# Patient Record
Sex: Female | Born: 1970 | Race: Black or African American | Hispanic: No | Smoking: Never smoker
Health system: Southern US, Community
[De-identification: ages and names within clinical notes are randomized; demographics above are authoritative.]

## PROBLEM LIST (undated history)

## (undated) DIAGNOSIS — L309 Dermatitis, unspecified: Secondary | ICD-10-CM

## (undated) DIAGNOSIS — IMO0002 Reserved for concepts with insufficient information to code with codable children: Secondary | ICD-10-CM

## (undated) DIAGNOSIS — I10 Essential (primary) hypertension: Secondary | ICD-10-CM

## (undated) HISTORY — PX: LAPAROSCOPY: SHX197

## (undated) HISTORY — DX: Dermatitis, unspecified: L30.9

## (undated) HISTORY — DX: Reserved for concepts with insufficient information to code with codable children: IMO0002

## (undated) HISTORY — DX: Essential (primary) hypertension: I10

---

## 1997-11-29 ENCOUNTER — Inpatient Hospital Stay (HOSPITAL_COMMUNITY): Admission: AD | Admit: 1997-11-29 | Discharge: 1997-12-01 | Payer: Self-pay | Admitting: Obstetrics & Gynecology

## 1997-12-28 ENCOUNTER — Encounter: Admission: RE | Admit: 1997-12-28 | Discharge: 1998-03-28 | Payer: Self-pay | Admitting: Obstetrics and Gynecology

## 1998-03-30 ENCOUNTER — Encounter (HOSPITAL_COMMUNITY): Admission: RE | Admit: 1998-03-30 | Discharge: 1998-06-28 | Payer: Self-pay | Admitting: Obstetrics and Gynecology

## 1998-10-07 ENCOUNTER — Other Ambulatory Visit: Admission: RE | Admit: 1998-10-07 | Discharge: 1998-10-07 | Payer: Self-pay | Admitting: Obstetrics and Gynecology

## 1998-10-22 DIAGNOSIS — R87619 Unspecified abnormal cytological findings in specimens from cervix uteri: Secondary | ICD-10-CM

## 1998-10-22 DIAGNOSIS — IMO0002 Reserved for concepts with insufficient information to code with codable children: Secondary | ICD-10-CM

## 1998-10-22 HISTORY — DX: Reserved for concepts with insufficient information to code with codable children: IMO0002

## 1998-10-22 HISTORY — PX: CERVICAL BIOPSY  W/ LOOP ELECTRODE EXCISION: SUR135

## 1998-10-22 HISTORY — DX: Unspecified abnormal cytological findings in specimens from cervix uteri: R87.619

## 1999-03-31 ENCOUNTER — Inpatient Hospital Stay (HOSPITAL_COMMUNITY): Admission: AD | Admit: 1999-03-31 | Discharge: 1999-04-02 | Payer: Self-pay | Admitting: Obstetrics and Gynecology

## 1999-07-06 ENCOUNTER — Other Ambulatory Visit: Admission: RE | Admit: 1999-07-06 | Discharge: 1999-07-06 | Payer: Self-pay | Admitting: Obstetrics and Gynecology

## 1999-07-06 ENCOUNTER — Encounter (INDEPENDENT_AMBULATORY_CARE_PROVIDER_SITE_OTHER): Payer: Self-pay

## 1999-10-02 ENCOUNTER — Encounter (INDEPENDENT_AMBULATORY_CARE_PROVIDER_SITE_OTHER): Payer: Self-pay | Admitting: Specialist

## 1999-10-02 ENCOUNTER — Ambulatory Visit (HOSPITAL_COMMUNITY): Admission: RE | Admit: 1999-10-02 | Discharge: 1999-10-02 | Payer: Self-pay | Admitting: Obstetrics and Gynecology

## 2000-03-03 ENCOUNTER — Encounter: Admission: RE | Admit: 2000-03-03 | Discharge: 2000-03-03 | Payer: Self-pay | Admitting: General Surgery

## 2000-10-22 HISTORY — PX: BREAST BIOPSY: SHX20

## 2001-02-28 ENCOUNTER — Encounter (HOSPITAL_BASED_OUTPATIENT_CLINIC_OR_DEPARTMENT_OTHER): Payer: Self-pay | Admitting: General Surgery

## 2001-02-28 ENCOUNTER — Other Ambulatory Visit: Admission: RE | Admit: 2001-02-28 | Discharge: 2001-02-28 | Payer: Self-pay | Admitting: General Surgery

## 2001-02-28 ENCOUNTER — Encounter: Admission: RE | Admit: 2001-02-28 | Discharge: 2001-02-28 | Payer: Self-pay | Admitting: General Surgery

## 2001-03-03 ENCOUNTER — Encounter (HOSPITAL_BASED_OUTPATIENT_CLINIC_OR_DEPARTMENT_OTHER): Payer: Self-pay | Admitting: General Surgery

## 2001-04-07 ENCOUNTER — Other Ambulatory Visit: Admission: RE | Admit: 2001-04-07 | Discharge: 2001-04-07 | Payer: Self-pay | Admitting: Obstetrics and Gynecology

## 2001-07-15 ENCOUNTER — Other Ambulatory Visit: Admission: RE | Admit: 2001-07-15 | Discharge: 2001-07-15 | Payer: Self-pay | Admitting: Obstetrics and Gynecology

## 2002-01-01 ENCOUNTER — Other Ambulatory Visit: Admission: RE | Admit: 2002-01-01 | Discharge: 2002-01-01 | Payer: Self-pay | Admitting: Obstetrics and Gynecology

## 2002-09-01 ENCOUNTER — Other Ambulatory Visit: Admission: RE | Admit: 2002-09-01 | Discharge: 2002-09-01 | Payer: Self-pay | Admitting: Obstetrics and Gynecology

## 2003-03-02 ENCOUNTER — Other Ambulatory Visit: Admission: RE | Admit: 2003-03-02 | Discharge: 2003-03-02 | Payer: Self-pay | Admitting: Obstetrics and Gynecology

## 2003-08-24 ENCOUNTER — Other Ambulatory Visit: Admission: RE | Admit: 2003-08-24 | Discharge: 2003-08-24 | Payer: Self-pay | Admitting: Obstetrics and Gynecology

## 2004-03-08 ENCOUNTER — Other Ambulatory Visit: Admission: RE | Admit: 2004-03-08 | Discharge: 2004-03-08 | Payer: Self-pay | Admitting: Obstetrics and Gynecology

## 2004-08-22 ENCOUNTER — Other Ambulatory Visit: Admission: RE | Admit: 2004-08-22 | Discharge: 2004-08-22 | Payer: Self-pay | Admitting: Obstetrics and Gynecology

## 2005-03-01 ENCOUNTER — Other Ambulatory Visit: Admission: RE | Admit: 2005-03-01 | Discharge: 2005-03-01 | Payer: Self-pay | Admitting: Obstetrics and Gynecology

## 2005-03-27 ENCOUNTER — Ambulatory Visit: Payer: Self-pay | Admitting: Family Medicine

## 2005-08-06 ENCOUNTER — Ambulatory Visit: Payer: Self-pay | Admitting: Family Medicine

## 2005-08-21 ENCOUNTER — Ambulatory Visit: Payer: Self-pay | Admitting: Family Medicine

## 2005-09-19 ENCOUNTER — Other Ambulatory Visit: Admission: RE | Admit: 2005-09-19 | Discharge: 2005-09-19 | Payer: Self-pay | Admitting: Obstetrics and Gynecology

## 2006-02-21 ENCOUNTER — Other Ambulatory Visit: Admission: RE | Admit: 2006-02-21 | Discharge: 2006-02-21 | Payer: Self-pay | Admitting: Obstetrics and Gynecology

## 2006-03-29 ENCOUNTER — Ambulatory Visit: Payer: Self-pay | Admitting: Family Medicine

## 2006-06-28 ENCOUNTER — Ambulatory Visit: Payer: Self-pay | Admitting: Family Medicine

## 2006-08-27 ENCOUNTER — Other Ambulatory Visit: Admission: RE | Admit: 2006-08-27 | Discharge: 2006-08-27 | Payer: Self-pay | Admitting: Obstetrics and Gynecology

## 2006-10-04 ENCOUNTER — Ambulatory Visit: Payer: Self-pay | Admitting: Family Medicine

## 2007-05-09 ENCOUNTER — Ambulatory Visit: Payer: Self-pay | Admitting: Internal Medicine

## 2007-05-15 ENCOUNTER — Telehealth (INDEPENDENT_AMBULATORY_CARE_PROVIDER_SITE_OTHER): Payer: Self-pay | Admitting: *Deleted

## 2007-06-26 DIAGNOSIS — J309 Allergic rhinitis, unspecified: Secondary | ICD-10-CM | POA: Insufficient documentation

## 2007-06-26 DIAGNOSIS — G43909 Migraine, unspecified, not intractable, without status migrainosus: Secondary | ICD-10-CM | POA: Insufficient documentation

## 2007-10-08 ENCOUNTER — Telehealth: Payer: Self-pay | Admitting: Family Medicine

## 2007-10-23 HISTORY — PX: TUBAL LIGATION: SHX77

## 2008-02-11 ENCOUNTER — Ambulatory Visit: Payer: Self-pay | Admitting: Family Medicine

## 2008-02-11 DIAGNOSIS — M542 Cervicalgia: Secondary | ICD-10-CM

## 2008-06-03 ENCOUNTER — Ambulatory Visit (HOSPITAL_COMMUNITY): Admission: RE | Admit: 2008-06-03 | Discharge: 2008-06-03 | Payer: Self-pay | Admitting: Obstetrics and Gynecology

## 2008-10-13 ENCOUNTER — Inpatient Hospital Stay (HOSPITAL_COMMUNITY): Admission: AD | Admit: 2008-10-13 | Discharge: 2008-10-15 | Payer: Self-pay | Admitting: Obstetrics and Gynecology

## 2008-10-14 ENCOUNTER — Encounter (INDEPENDENT_AMBULATORY_CARE_PROVIDER_SITE_OTHER): Payer: Self-pay | Admitting: Obstetrics and Gynecology

## 2008-11-16 ENCOUNTER — Ambulatory Visit: Payer: Self-pay | Admitting: Family Medicine

## 2008-11-16 DIAGNOSIS — L259 Unspecified contact dermatitis, unspecified cause: Secondary | ICD-10-CM

## 2009-02-02 ENCOUNTER — Emergency Department (HOSPITAL_COMMUNITY): Admission: EM | Admit: 2009-02-02 | Discharge: 2009-02-02 | Payer: Self-pay | Admitting: Family Medicine

## 2009-02-02 ENCOUNTER — Telehealth: Payer: Self-pay | Admitting: Family Medicine

## 2009-05-05 ENCOUNTER — Emergency Department (HOSPITAL_COMMUNITY): Admission: EM | Admit: 2009-05-05 | Discharge: 2009-05-05 | Payer: Self-pay | Admitting: Family Medicine

## 2009-05-25 ENCOUNTER — Ambulatory Visit: Payer: Self-pay | Admitting: Family Medicine

## 2009-05-25 DIAGNOSIS — R3915 Urgency of urination: Secondary | ICD-10-CM

## 2009-05-25 LAB — CONVERTED CEMR LAB
Bilirubin Urine: NEGATIVE
Blood in Urine, dipstick: NEGATIVE
Glucose, Urine, Semiquant: NEGATIVE
Ketones, urine, test strip: NEGATIVE
Nitrite: NEGATIVE
Specific Gravity, Urine: 1.015
Urobilinogen, UA: 0.2
pH: 7

## 2009-09-27 ENCOUNTER — Ambulatory Visit: Payer: Self-pay | Admitting: Family Medicine

## 2009-09-27 LAB — CONVERTED CEMR LAB
ALT: 12 units/L (ref 0–35)
AST: 17 units/L (ref 0–37)
Albumin: 4.1 g/dL (ref 3.5–5.2)
Alkaline Phosphatase: 45 units/L (ref 39–117)
BUN: 11 mg/dL (ref 6–23)
Basophils Absolute: 0 10*3/uL (ref 0.0–0.1)
Basophils Relative: 0.5 % (ref 0.0–3.0)
Bilirubin Urine: NEGATIVE
Bilirubin, Direct: 0.1 mg/dL (ref 0.0–0.3)
CO2: 28 meq/L (ref 19–32)
Calcium: 9.2 mg/dL (ref 8.4–10.5)
Chloride: 108 meq/L (ref 96–112)
Cholesterol: 171 mg/dL (ref 0–200)
Creatinine, Ser: 0.6 mg/dL (ref 0.4–1.2)
Eosinophils Absolute: 0 10*3/uL (ref 0.0–0.7)
Eosinophils Relative: 0.8 % (ref 0.0–5.0)
GFR calc non Af Amer: 143.26 mL/min (ref >60)
Glucose, Bld: 85 mg/dL (ref 70–99)
HCT: 39.2 % (ref 36.0–46.0)
HDL: 71.5 mg/dL (ref >39.00)
Hemoglobin: 13.3 g/dL (ref 12.0–15.0)
Ketones, ur: NEGATIVE mg/dL
LDL Cholesterol: 92 mg/dL (ref 0–99)
Leukocytes, UA: NEGATIVE
Lymphocytes Relative: 32.1 % (ref 12.0–46.0)
Lymphs Abs: 1.3 10*3/uL (ref 0.7–4.0)
MCHC: 33.8 g/dL (ref 30.0–36.0)
MCV: 96.9 fL (ref 78.0–100.0)
Monocytes Absolute: 0.4 10*3/uL (ref 0.1–1.0)
Monocytes Relative: 9.9 % (ref 3.0–12.0)
Neutro Abs: 2.3 10*3/uL (ref 1.4–7.7)
Neutrophils Relative %: 56.7 % (ref 43.0–77.0)
Nitrite: NEGATIVE
Platelets: 163 10*3/uL (ref 150.0–400.0)
Potassium: 3.7 meq/L (ref 3.5–5.1)
RBC: 4.05 M/uL (ref 3.87–5.11)
RDW: 12.2 % (ref 11.5–14.6)
Sodium: 141 meq/L (ref 135–145)
Specific Gravity, Urine: >=1.03 (ref 1.000–1.030)
TSH: 0.97 microintl units/mL (ref 0.35–5.50)
Total Bilirubin: 0.7 mg/dL (ref 0.3–1.2)
Total CHOL/HDL Ratio: 2
Total Protein, Urine: NEGATIVE mg/dL
Total Protein: 7.1 g/dL (ref 6.0–8.3)
Triglycerides: 38 mg/dL (ref 0.0–149.0)
Urine Glucose: NEGATIVE mg/dL
Urobilinogen, UA: 0.2 (ref 0.0–1.0)
VLDL: 7.6 mg/dL (ref 0.0–40.0)
WBC: 4 10*3/uL — ABNORMAL LOW (ref 4.5–10.5)
pH: 5.5 (ref 5.0–8.0)

## 2009-10-05 ENCOUNTER — Ambulatory Visit: Payer: Self-pay | Admitting: Family Medicine

## 2009-10-05 ENCOUNTER — Encounter (INDEPENDENT_AMBULATORY_CARE_PROVIDER_SITE_OTHER): Payer: Self-pay | Admitting: *Deleted

## 2009-10-05 DIAGNOSIS — L0100 Impetigo, unspecified: Secondary | ICD-10-CM

## 2009-11-27 IMAGING — US US AMNIOCENTESIS
1 series · 3 of 3 positions shown · non-contrast
Comparison: none

OBSTETRICAL ULTRASOUND:
 This ultrasound exam was performed in the [HOSPITAL] Ultrasound Department.  The OB US report was generated in the AS system, and faxed to the ordering physician.  This report is also available in [REDACTED] PACS.

[Series 1: us amniocentesis · 0.28mm/px · 3 of 3 slices shown]
[im 1/3]
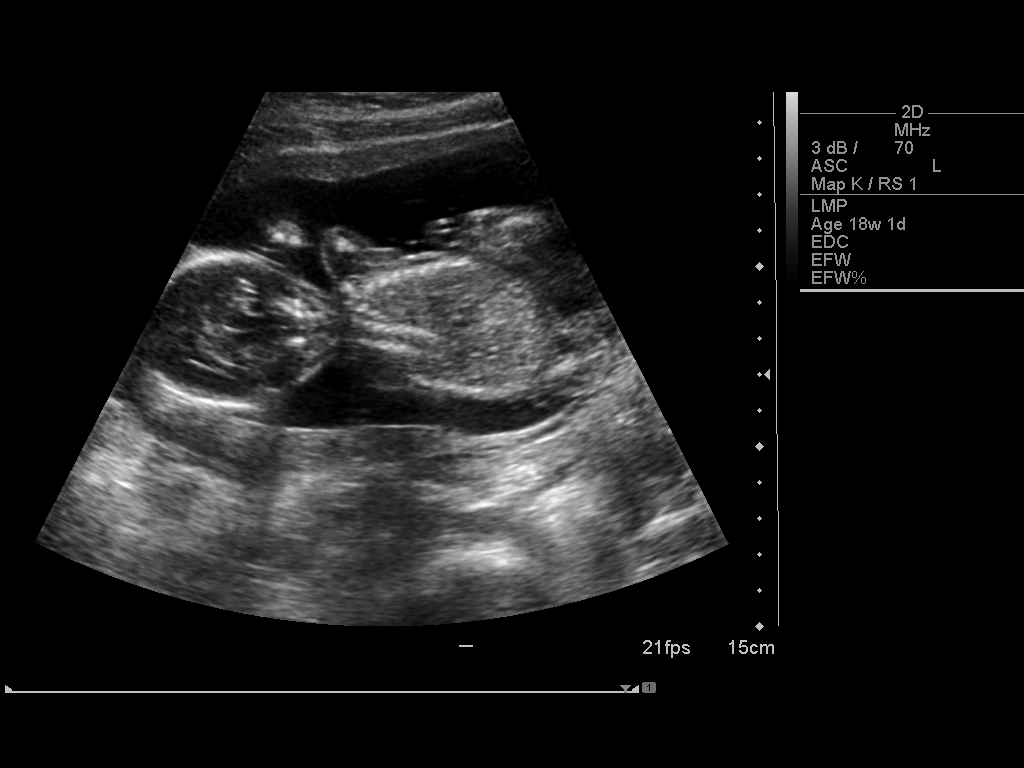
[im 2/3]
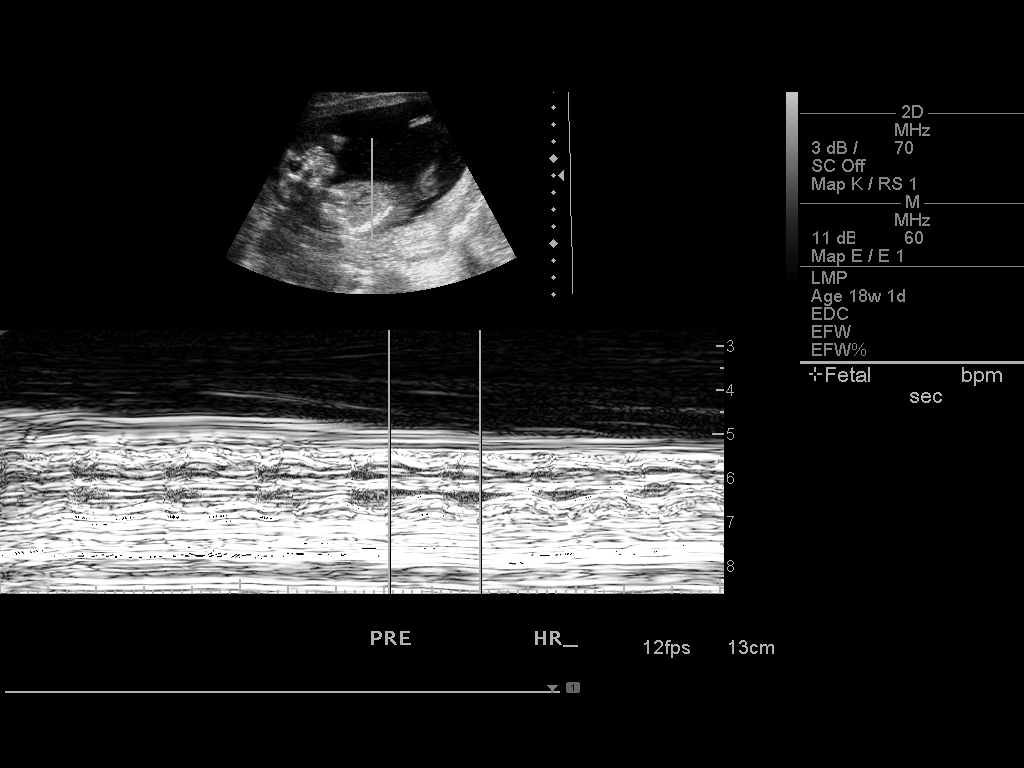
[im 3/3]
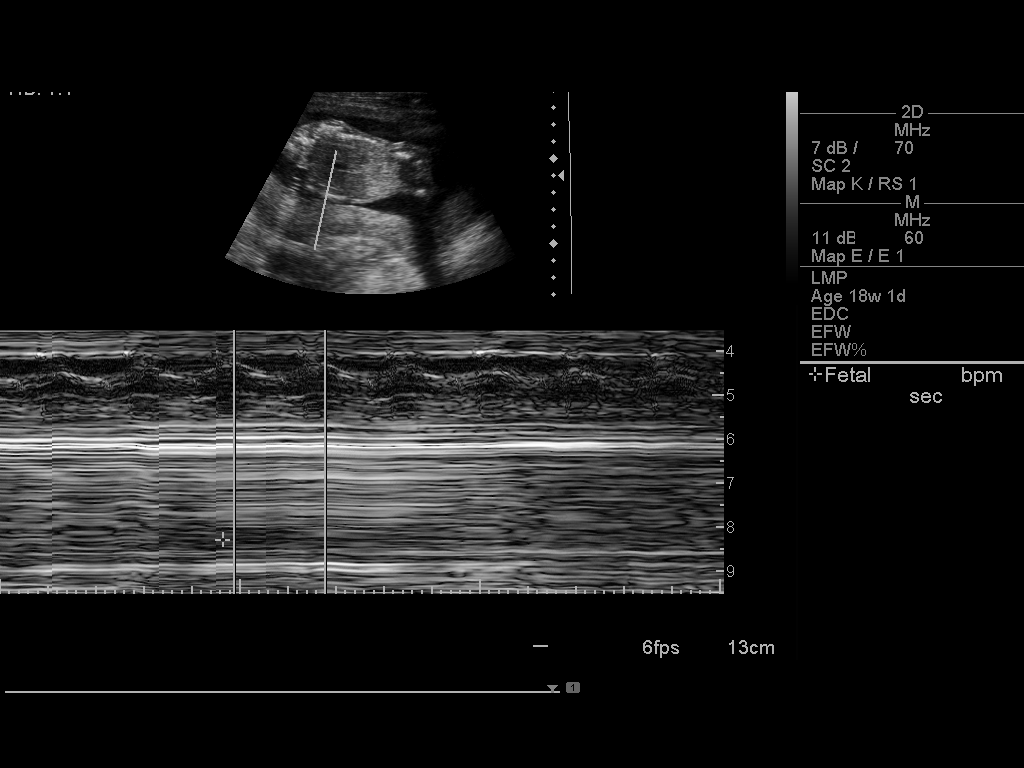

[3 of 3 positions shown; findings below may reference images not displayed]

IMPRESSION: See AS Obstetric US report.

## 2009-11-29 ENCOUNTER — Emergency Department (HOSPITAL_COMMUNITY): Admission: EM | Admit: 2009-11-29 | Discharge: 2009-11-29 | Payer: Self-pay | Admitting: Emergency Medicine

## 2009-12-02 ENCOUNTER — Ambulatory Visit: Payer: Self-pay | Admitting: Internal Medicine

## 2009-12-02 ENCOUNTER — Encounter: Payer: Self-pay | Admitting: Family Medicine

## 2010-03-13 ENCOUNTER — Ambulatory Visit: Payer: Self-pay | Admitting: Family Medicine

## 2010-03-13 DIAGNOSIS — H00019 Hordeolum externum unspecified eye, unspecified eyelid: Secondary | ICD-10-CM | POA: Insufficient documentation

## 2010-08-14 ENCOUNTER — Ambulatory Visit: Payer: Self-pay | Admitting: Family Medicine

## 2010-08-14 DIAGNOSIS — R109 Unspecified abdominal pain: Secondary | ICD-10-CM

## 2010-08-14 DIAGNOSIS — K5900 Constipation, unspecified: Secondary | ICD-10-CM | POA: Insufficient documentation

## 2010-08-14 LAB — CONVERTED CEMR LAB
Bilirubin Urine: NEGATIVE
Glucose, Urine, Semiquant: NEGATIVE
Ketones, urine, test strip: NEGATIVE
Nitrite: NEGATIVE
Protein, U semiquant: NEGATIVE
Specific Gravity, Urine: 1.02
Urobilinogen, UA: 0.2
WBC Urine, dipstick: NEGATIVE
pH: 6

## 2010-08-15 ENCOUNTER — Encounter: Payer: Self-pay | Admitting: Family Medicine

## 2010-10-11 ENCOUNTER — Encounter: Payer: Self-pay | Admitting: Family Medicine

## 2010-10-11 ENCOUNTER — Ambulatory Visit: Payer: Self-pay | Admitting: Family Medicine

## 2010-10-11 LAB — CONVERTED CEMR LAB
ALT: 11 units/L (ref 0–35)
AST: 16 units/L (ref 0–37)
Albumin: 3.9 g/dL (ref 3.5–5.2)
Chloride: 108 meq/L (ref 96–112)
Eosinophils Relative: 0.7 % (ref 0.0–5.0)
GFR calc non Af Amer: 132.27 mL/min (ref >60.00)
Glucose, Bld: 76 mg/dL (ref 70–99)
Glucose, Urine, Semiquant: NEGATIVE
HCT: 40.2 % (ref 36.0–46.0)
Hemoglobin: 14 g/dL (ref 12.0–15.0)
Lymphs Abs: 0.5 10*3/uL — ABNORMAL LOW (ref 0.7–4.0)
Monocytes Relative: 8.5 % (ref 3.0–12.0)
Neutro Abs: 2.7 10*3/uL (ref 1.4–7.7)
Potassium: 3.8 meq/L (ref 3.5–5.1)
RDW: 13.3 % (ref 11.5–14.6)
Sodium: 140 meq/L (ref 135–145)
Specific Gravity, Urine: 1.02
TSH: 0.92 microintl units/mL (ref 0.35–5.50)
VLDL: 5.2 mg/dL (ref 0.0–40.0)
WBC Urine, dipstick: NEGATIVE
WBC: 3.5 10*3/uL — ABNORMAL LOW (ref 4.5–10.5)
pH: 5

## 2010-10-13 ENCOUNTER — Telehealth (INDEPENDENT_AMBULATORY_CARE_PROVIDER_SITE_OTHER): Payer: Self-pay | Admitting: *Deleted

## 2010-10-20 ENCOUNTER — Encounter: Payer: Self-pay | Admitting: Family Medicine

## 2010-11-23 NOTE — Progress Notes (Signed)
Summary: labs-  Phone Note Outgoing Call   Call placed by: Doristine Devoid CMA,  October 13, 2010 4:44 PM Call placed to: Patient Summary of Call: vit d level is very low.  should start 50,000 units weekly x12 weeks and then recheck   Follow-up for Phone Call        left message on machine ..........Marland KitchenDoristine Devoid CMA  October 13, 2010 4:45 PM   left message on machine ..........Marland KitchenDoristine Devoid CMA  October 19, 2010 8:43 AM   spoke w/ patient aware of labs and instructions.......Marland KitchenDoristine Devoid CMA  October 20, 2010 4:52 PM     New/Updated Medications: VITAMIN D (ERGOCALCIFEROL) 50000 UNIT CAPS (ERGOCALCIFEROL) take one tablet weekly x12 weeks Prescriptions: VITAMIN D (ERGOCALCIFEROL) 50000 UNIT CAPS (ERGOCALCIFEROL) take one tablet weekly x12 weeks  #12 x 0   Entered by:   Doristine Devoid CMA   Authorized by:   Neena Rhymes MD   Signed by:   Doristine Devoid CMA on 10/20/2010   Method used:   Electronically to        [facility] [address] [phone]* (retail)       [address]       [place], [place]  [address]       Ph: [phone] or [phone]       Fax: [phone]   RxID:   [id number]

## 2010-11-23 NOTE — Assessment & Plan Note (Signed)
Summary: new to est from [facility]//[facility]   Vital Signs:  Patient profile:   40 year old female Height:      63.5 inches Weight:      111 pounds BMI:     19.42 Pulse rate:   78 / minute BP sitting:   108 / 60  (right arm)  Vitals Entered By: Doristine Devoid CMA (August 14, 2010 10:59 AM) CC: new est- UTI    History of Present Illness: 40 yo woman here today to transfer care from [place] office.    ? UTI- feels similar to previous UTI.  drinks limited water.  started period so difficult to determine if hematuria.  no dysuria.  denies frequency, urgency, hesitancy.  only complaint is suprapubic pressure.  no N/V, fevers.  sxs started 1-2 weeks ago.  constipation- hx of hemorrhoids, will have BMs every 2-3 days.  limited fiber in diet, limited water, little activity.  abd pain coincided w/ bad bout of constipation- had to use enema.  has used miralax in the past w/ good success but doesnt use regularly.  Preventive Screening-Counseling & Management  Alcohol-Tobacco     Alcohol drinks/day: 0     Smoking Status: never  Caffeine-Diet-Exercise     Does Patient Exercise: no      Sexual History:  currently monogamous.        Drug Use:  never.    Current Medications (verified): 1)  Nortriptyline Hcl 25 Mg Caps (Nortriptyline Hcl) .... Take One Tablet At Bedtime 2)  Promethazine Hcl 25 Mg Tabs (Promethazine Hcl) .... Take One Tablet As Needed  Allergies (verified): 1)  ! Flagyl  Past History:  Past Medical History: Allergic rhinitis migraine headaches- Dr Adelene Idler eczema  Social History: in a relationship, 3 girls (2009, 00, 32) works at [employer] Never Smoked Alcohol use-no Drug use-no Does Patient Exercise:  no Sexual History:  currently monogamous Drug Use:  never  Review of Systems      See HPI  Physical Exam  General:  Well-developed,well-nourished,in no acute distress; alert,appropriate and cooperative throughout examination Lungs:  Normal respiratory effort,  chest expands symmetrically. Lungs are clear to auscultation, no crackles or wheezes. Heart:  Normal rate and regular rhythm. S1 and S2 normal without gallop, murmur, click, rub or other extra sounds. Abdomen:  soft, mild suprapubic tenderness, no rebound or guarding.  + BS.  no CVA tenderness.   Impression & Recommendations:  Problem # 1:  ABDOMINAL PAIN, SUPRAPUBIC (ICD-789.09) Assessment New pt's suprapubic pain similar to previous UTI but denies any other urinary sxs.  + blood on UA but pt having period.  will send urine for cx and start cipro empirically but reviewed w/ pt that abd pain can be caused by constipation or current menses.  reviewed supportive care and red flags that should prompt return.  Pt expresses understanding and is in agreement w/ this plan.  Problem # 2:  CONSTIPATION (ICD-564.00) Assessment: New reviewed importance of increased fluid intake, fiber intake, and regular exercise.  pt to start Miralax if no BM in 2 days.  will follow.  Complete Medication List: 1)  Nortriptyline Hcl 25 Mg Caps (Nortriptyline hcl) .... Take one tablet at bedtime 2)  Promethazine Hcl 25 Mg Tabs (Promethazine hcl) .... Take one tablet as needed 3)  Cipro 500 Mg Tabs (Ciprofloxacin hcl) .Marland Kitchen.. 1 by mouth 2 times daily  Other Orders: Specimen Handling (82956) T-Culture, Urine ([id number]) UA Dipstick w/o Micro (manual) (69629)  Patient Instructions: 1)  Schedule your  complete physical at your convenience- don't eat before this appt 2)  Start the Cipro as directed for possible UTI 3)  Drink plenty of fluids 4)  Increase your fiber intake 5)  Try and move regularly 6)  Use Miralax if it has been 2 or more days between BMs 7)  We'll notify you of your urine culture 8)  If your abdominal pain worsens, you develop fever, or other concerns- please call 9)  Welcome!  We're glad to have you! Prescriptions: CIPRO 500 MG TABS (CIPROFLOXACIN HCL) 1 by mouth 2 times daily  #6 x 0   Entered  and Authorized by:   Neena Rhymes MD   Signed by:   Neena Rhymes MD on 08/14/2010   Method used:   Electronically to        [facility] [address] [phone]* (retail)       [address]       [place], [place]  [address]       Ph: [phone] or [phone]       Fax: [phone]   RxID:   [id number]    Orders Added: 1)  Specimen Handling [99000] 2)  T-Culture, Urine [[id number]] 3)  UA Dipstick w/o Micro (manual) [81002] 4)  Est. Patient Level III [93716]    Laboratory Results   Urine Tests    Routine Urinalysis   Glucose: negative   (Normal Range: Negative) Bilirubin: negative   (Normal Range: Negative) Ketone: negative   (Normal Range: Negative) Spec. Gravity: 1.020   (Normal Range: 1.003-1.035) Blood: large   (Normal Range: Negative) pH: 6.0   (Normal Range: 5.0-8.0) Protein: negative   (Normal Range: Negative) Urobilinogen: 0.2   (Normal Range: 0-1) Nitrite: negative   (Normal Range: Negative) Leukocyte Esterace: negative   (Normal Range: Negative)    Comments: patient on menses........Marland KitchenDoristine Devoid CMA  August 14, 2010 11:49 AM

## 2010-11-23 NOTE — Assessment & Plan Note (Signed)
Summary: CONJUCTIVITIS // RS   Vital Signs:  Patient profile:   40 year old female Weight:      106 pounds BMI:     18.55 Temp:     98.9 degrees F oral BP sitting:   148 / 86  (left arm) Cuff size:   regular  Vitals Entered By: Raechel Ache, RN (Mar 13, 2010 3:30 PM) CC: C/o R eye sore and swollen, crusty in the morning.   History of Present Illness: Here for one week of a tender bump on the right upper eyelid which sometimes causes the entire lid to swell up. No other symptoms. She does not wear contacts.   Allergies: 1)  ! Flagyl  Past History:  Past Medical History: Reviewed history from 11/16/2008 and no changes required. Allergic rhinitis migraine headaches eczema  Past Surgical History: Reviewed history from 02/11/2008 and no changes required. Laparoscopy   Review of Systems  The patient denies anorexia, fever, weight loss, weight gain, vision loss, decreased hearing, hoarseness, chest pain, syncope, dyspnea on exertion, peripheral edema, prolonged cough, headaches, hemoptysis, abdominal pain, melena, hematochezia, severe indigestion/heartburn, hematuria, incontinence, genital sores, muscle weakness, suspicious skin lesions, transient blindness, difficulty walking, depression, unusual weight change, abnormal bleeding, enlarged lymph nodes, angioedema, breast masses, and testicular masses.    Physical Exam  General:  Well-developed,well-nourished,in no acute distress; alert,appropriate and cooperative throughout examination Head:  Normocephalic and atraumatic without obvious abnormalities. No apparent alopecia or balding. Eyes:  right upper eyelid has a small tender stye underneath. The eye itself is clear.  Ears:  External ear exam shows no significant lesions or deformities.  Otoscopic examination reveals clear canals, tympanic membranes are intact bilaterally without bulging, retraction, inflammation or discharge. Hearing is grossly normal bilaterally. Nose:   External nasal examination shows no deformity or inflammation. Nasal mucosa are pink and moist without lesions or exudates. Mouth:  Oral mucosa and oropharynx without lesions or exudates.  Teeth in good repair. Neck:  No deformities, masses, or tenderness noted.   Impression & Recommendations:  Problem # 1:  STYE (ICD-373.11)  Complete Medication List: 1)  Imitrex 100 Mg Tabs (Sumatriptan succinate) .... As needed 2)  Bactroban 2 % Oint (Mupirocin) .... Apply three times a day as needed 3)  Sulfacetamide Sodium 10 % Soln (Sulfacetamide sodium) .... 2 drops in eye q 4 hours as needed  Patient Instructions: 1)  add hot compresses.  2)  Please schedule a follow-up appointment as needed .  Prescriptions: SULFACETAMIDE SODIUM 10 % SOLN (SULFACETAMIDE SODIUM) 2 drops in eye q 4 hours as needed  #10 x 0   Entered and Authorized by:   Nelwyn Salisbury MD   Signed by:   Nelwyn Salisbury MD on 03/13/2010   Method used:   Electronically to        [facility] [address] [phone]* (retail)       [address]       [place], [place]  [address]       Ph: [phone] or [phone]       Fax: [phone]   RxID:   [id number]

## 2010-11-23 NOTE — Miscellaneous (Signed)
Summary: BONE DENSITY  Clinical Lists Changes  Orders: Added new Test order of T-Bone Densitometry (77080) - Signed Added new Test order of T-Lumbar Vertebral Assessment (77082) - Signed 

## 2010-11-23 NOTE — Letter (Signed)
Summary: Metabolic Syndrome Form/[employer]  Metabolic Syndrome Form/[employer]   Imported By: Lanelle Bal 10/30/2010 09:13:44  _____________________________________________________________________  External Attachment:    Type:   Image     Comment:   External Document

## 2010-11-23 NOTE — Assessment & Plan Note (Signed)
Summary: CPX AND FASTING LABS///[facility]   Vital Signs:  Patient profile:   40 year old female Height:      63.5 inches Weight:      111 pounds BMI:     19.42 Pulse rate:   113 / minute BP sitting:   116 / 80  (left arm)  Vitals Entered By: Doristine Devoid CMA (October 11, 2010 8:47 AM) CC: CPX AND LABS    History of Present Illness: 40 yo woman here today for CPE.  GYN- [facility], UTD on pap, overdue for mammogram.  no concerns about her health  Preventive Screening-Counseling & Management  Alcohol-Tobacco     Alcohol drinks/day: <1     Smoking Status: never  Caffeine-Diet-Exercise     Does Patient Exercise: no      Sexual History:  currently monogamous.        Drug Use:  never.    Current Medications (verified): 1)  Nortriptyline Hcl 25 Mg Caps (Nortriptyline Hcl) .... Take One Tablet At Bedtime 2)  Promethazine Hcl 25 Mg Tabs (Promethazine Hcl) .... Take One Tablet As Needed  Allergies (verified): 1)  ! Flagyl  Past History:  Past medical, surgical, family and social histories (including risk factors) reviewed, and no changes noted (except as noted below).  Past Medical History: Reviewed history from 08/14/2010 and no changes required. Allergic rhinitis migraine headaches- Dr Adelene Idler eczema  Past Surgical History: Reviewed history from 02/11/2008 and no changes required. Laparoscopy   Family History: Reviewed history from 02/11/2008 and no changes required. Family History Breast cancer 1st degree relative <50 Family History Diabetes 1st degree relative Family History Hypertension  Social History: Reviewed history from 08/14/2010 and no changes required. in a relationship, 3 girls (2009, 00, 99) works at [employer] Alcohol use-no Drug use-no  Review of Systems  The patient denies anorexia, fever, weight loss, weight gain, vision loss, decreased hearing, hoarseness, chest pain, syncope, dyspnea on exertion, peripheral edema, prolonged cough,  headaches, abdominal pain, melena, hematochezia, severe indigestion/heartburn, hematuria, suspicious skin lesions, depression, abnormal bleeding, enlarged lymph nodes, and breast masses.    Physical Exam  General:  Well-developed,well-nourished,in no acute distress; alert,appropriate and cooperative throughout examination Head:  Normocephalic and atraumatic without obvious abnormalities. No apparent alopecia or balding. Eyes:  No corneal or conjunctival inflammation noted. EOMI. Perrla. Funduscopic exam benign, without hemorrhages, exudates or papilledema. Vision grossly normal. Ears:  External ear exam shows no significant lesions or deformities.  Otoscopic examination reveals clear canals, tympanic membranes are intact bilaterally without bulging, retraction, inflammation or discharge. Hearing is grossly normal bilaterally. Nose:  External nasal examination shows no deformity or inflammation. Nasal mucosa are pink and moist without lesions or exudates. Mouth:  Oral mucosa and oropharynx without lesions or exudates.  Teeth in good repair. Neck:  No deformities, masses, or tenderness noted. Breasts:  gyn Lungs:  Normal respiratory effort, chest expands symmetrically. Lungs are clear to auscultation, no crackles or wheezes. Heart:  Normal rate and regular rhythm. S1 and S2 normal without gallop, murmur, click, rub or other extra sounds. Abdomen:  Bowel sounds positive,abdomen soft and non-tender without masses, organomegaly or hernias noted. Genitalia:  gyn Pulses:  +2 carotid, radial, DP Extremities:  No clubbing, cyanosis, edema, or deformity noted with normal full range of motion of all joints.   Neurologic:  No cranial nerve deficits noted. Station and gait are normal. Plantar reflexes are down-going bilaterally. DTRs are symmetrical throughout. Sensory, motor and coordinative functions appear intact. Skin:  Intact  without suspicious lesions or rashes Cervical Nodes:  No lymphadenopathy  noted Psych:  Cognition and judgment appear intact. Alert and cooperative with normal attention span and concentration. No apparent delusions, illusions, hallucinations   Impression & Recommendations:  Problem # 1:  HEALTH MAINTENANCE EXAM (ICD-V70.0) Assessment Unchanged pt's PE WNL.  check labs.  encouraged her to schedule mammogram.  anticipatory guidance provided. Orders: Venipuncture (16109) T-Vitamin D (25-Hydroxy) [id number]) UA Dipstick W/ Micro (manual) (91478) TLB-Lipid Panel (80061-LIPID) TLB-BMP (Basic Metabolic Panel-BMET) (80048-METABOL) TLB-CBC Platelet - w/Differential (85025-CBCD) TLB-Hepatic/Liver Function Pnl (80076-HEPATIC) TLB-TSH (Thyroid Stimulating Hormone) (84443-TSH)  Complete Medication List: 1)  Nortriptyline Hcl 25 Mg Caps (Nortriptyline hcl) .... Take one tablet at bedtime 2)  Promethazine Hcl 25 Mg Tabs (Promethazine hcl) .... Take one tablet as needed 3)  Vitamin D (ergocalciferol) 50000 Unit Caps (Ergocalciferol) .... Take one tablet weekly x12 weeks  Patient Instructions: 1)  Your exam looks great!  Keep up the good work! 2)  We'll notify you of your lab results 3)  Call with any questions or concerns 4)  Happy Holidays!   Orders Added: 1)  Venipuncture [36415] 2)  T-Vitamin D (25-Hydroxy) [29562-13086] 3)  UA Dipstick W/ Micro (manual) [81000] 4)  TLB-Lipid Panel [80061-LIPID] 5)  TLB-BMP (Basic Metabolic Panel-BMET) [80048-METABOL] 6)  TLB-CBC Platelet - w/Differential [85025-CBCD] 7)  TLB-Hepatic/Liver Function Pnl [80076-HEPATIC] 8)  TLB-TSH (Thyroid Stimulating Hormone) [84443-TSH] 9)  Est. Patient 18-39 years [99395]    Laboratory Results   Urine Tests   Date/Time Reported: October 11, 2010 11:10 AM   Routine Urinalysis   Color: yellow Appearance: Clear Glucose: negative   (Normal Range: Negative) Bilirubin: negative   (Normal Range: Negative) Ketone: negative   (Normal Range: Negative) Spec. Gravity: 1.020   (Normal  Range: 1.003-1.035) Blood: negative   (Normal Range: Negative) pH: 5.0   (Normal Range: 5.0-8.0) Protein: trace   (Normal Range: Negative) Urobilinogen: negative   (Normal Range: 0-1) Nitrite: negative   (Normal Range: Negative) Leukocyte Esterace: negative   (Normal Range: Negative)    Comments: Floydene Flock  October 11, 2010 11:11 AM

## 2011-03-06 NOTE — Discharge Summary (Signed)
NAMESKILER, TYE                 ACCOUNT NO.:  [id number]   MEDICAL RECORD NO.:  [id number]          PATIENT TYPE:  INP   LOCATION:  9106                          FACILITY:  [facility]   PHYSICIAN:  Osborn Coho, M.D.   DATE OF BIRTH:  May 20, 1971   DATE OF ADMISSION:  10/13/2008  DATE OF DISCHARGE:  10/14/2008                               DISCHARGE SUMMARY   ADMITTING DIAGNOSES:  1. Intrauterine pregnancy at 37 weeks.  2. Spontaneous rupture of membranes.  3. Advanced maternal age.  4. History of cervical conization.  5. History of left ovarian cyst.   DISCHARGE DIAGNOSES:  1. Intrauterine pregnancy at 37 weeks.  2. Spontaneous rupture of membranes.  3. Advanced maternal age.  4. History of cervical conization.  5. History of left ovarian cyst.  6. Desires tubal sterilization.   PROCEDURES:  1. Normal spontaneous vaginal birth.  2. Bilateral tubal ligation.  3. General anesthesia.   HOSPITAL COURSE:  Ms. Mcmorris is a 40 year old gravida 3, para 2-0-0-2 at  37 weeks who presented complaining of spontaenous rupture of membranes  approximately at 2:30 p.m. in the afternoon at 12:23.  She presented at  approximately 6 p.m.  She had had a cough which had caused her to leak  urine sporadically but then had an increased gush and a continuing gush  since 2:30 p.m.  Her pregnancy had been remarkable for:  1. Group B strep negative.  2. Advanced maternal age with an elevated Down syndrome risk by      quadruple screen, normal amnio shown XX.  3. History of cervical conization.  4. Decreased BMR.  5. First trimester spotting.  6. History of left ovarian cyst, stable approximately at 5.6 cm,      stable since about 24 weeks.  7. Migraines.  8. History of rapid labor x2 with her last delivery in 2000.   On admission, cervix was 2+, 100% vertex and -1.  She was leaking clear  fluid.  Uterine contractions every 3-5 minutes.  The patient was  admitted.  She declined pain medication.   IUPC was placed by Dr.  Stefano Gaul later that evening and Pitocin argumentation was begun.  The  patient then progressed rapidly and was completely dilated at 10:50 and  delivered at 10:57 a viable female, weight 5 pounds 12 ounces.  Apgars  were 8 and 9.  Infant was taken to the full-term nursery and mother was  taken to recovery in good condition.  There were no lacerations.  By  postpartum day 1, the patient was doing well.  She was desiring a tubal  sterilization, this was performed by Dr. Su Hilt under general  anesthesia without complications.  The patient's hemoglobin was 9.9 down  from 11.6, white blood cell count was 16, and platelet count was 229.  She is still having some amount of hacking cough that was residual from  an upper respiratory infection.  She was using Robitussin with good  benefit.  By the afternoon of postpartum day 1, postop day 0, she was  interested in going home, later this evening  if that was possible from  the baby's standpoint.  Discharge instructions were reviewed with the  patient and prescriptions were given.  The decision was made to allow  her to be discharged tonight, if the baby was allowed to be discharged.  She was deemed to receive full benefit of her hospital stay and was  discharged home that evening pending the baby's discharge.   DISCHARGE INSTRUCTIONS:  Per [facility] handout.   DISCHARGE MEDICATIONS:  1. Motrin 600 mg p.o. q.6 h. p.r.n. pain.  2. Percocet 5/325 one to two p.o. q.3-4 h. p.r.n. pain.  3. Tussionex 1 teaspoon q.12 h. p.r.n. cough.   DISCHARGE FOLLOWUP:  Discharge followup will occur in 6 weeks at [facility] or p.r.n.      Renaldo Reel Emilee Hero, C.N.M.      Osborn Coho, M.D.  Electronically Signed    VLL/MEDQ  D:  10/14/2008  T:  10/15/2008  Job:  [id number]

## 2011-03-06 NOTE — Op Note (Signed)
[name], [name]                 ACCOUNT NO.:  [id number]   MEDICAL RECORD NO.:  [id number]          PATIENT TYPE:  INP   LOCATION:  9106                          FACILITY:  [facility]   PHYSICIAN:  Osborn Coho, M.D.   DATE OF BIRTH:  06-Nov-1970   DATE OF PROCEDURE:  10/14/2008  DATE OF DISCHARGE:                               OPERATIVE REPORT   PREOPERATIVE DIAGNOSES:  1. Desires permanent sterilization.  2. Multiparous.   POSTOPERATIVE DIAGNOSES:  1. Desires permanent sterilization.  2. Multiparous.   PROCEDURE:  Postpartum bilateral tubal ligation.   ATTENDING PHYSICIAN:  Osborn Coho, MD   ANESTHESIA:  General.   SPECIMENS TO PATHOLOGY:  Portions of bilateral fallopian tubes.   FLUIDS:  200 mL.   URINE OUTPUT:  The patient voided prior to procedure.   ESTIMATED BLOOD LOSS:  Minimal.   COMPLICATIONS:  None.   PROCEDURE IN DETAIL:  The patient was taken to the operating room after  the risks, benefits, and alternatives.  Discussed with the patient.  The  patient verbalized understanding and consent signed and witnessed.  The  patient was placed under general via anesthesia, prepped and draped in a  normal sterile fashion in the supine position.  A 10-mm incision was  made at the umbilicus and carried down to the underlying layer of fascia  and peritoneum sharply with the Mayo and Metzenbaum scissors.  Retractors were placed and the right fallopian tube was identified and  carried out to its fimbriated end after being grasped with the Babcock.  The Tanja Port was placed in the isthmic portion and 2 free ties of 2-0  plain were placed on the fallopian tube for ligation.  The tube was  excised and the remaining pedicle was cauterized with the Bovie.  There  was good hemostasis.  The portion of the fallopian tube was sent to  Pathology.  The same was done on the contralateral side after carrying  that fallopian tube out to its fimbriated end.  There was good  hemostasis.   The peritoneum and fascia was repaired with 0 Vicryl in a  running  fashion.  The skin was reapproximated using 3-0 Monocryl, via a  subcuticular stitch.  The skin was prepped with Dermabond.  Sponge, lap,  and needle count was correct.  The patient tolerated the procedure well  and was awaiting transfer to the recovery room in good condition.      Osborn Coho, M.D.  Electronically Signed     AR/MEDQ  D:  10/14/2008  T:  10/14/2008  Job:  [id number]

## 2011-03-06 NOTE — Op Note (Signed)
NAMETYGER, OKA                 ACCOUNT NO.:  [id number]   MEDICAL RECORD NO.:  [id number]          PATIENT TYPE:  OUT   LOCATION:  ULT                           F[facility]ILITY:  [facility]   PHYSICIAN:  Janine Limbo, M.D.DATE OF BIRTH:  11-Jul-1971   DATE OF PROCEDURE:  DATE OF DISCHARGE:                               OPERATIVE REPORT   PREOPERATIVE DIAGNOSES:  1. An 18-week gestation.  2. Age greater than 35.  3. Abnormal quad screen.   POSTOPERATIVE DIAGNOSES:  1. A 18-week gestation.  2. Age greater than 35.  3. Abnormal quad screen.Marland Kitchen   PROCEDURE:  Genetic amniocentesis.   SURGEON:  Janine Limbo, M.D.   FIRST ASSISTANT:  None.   ANESTHETIC:  Local Xylocaine.   DISPOSITION:  Ms. Croslin is a 40 year old female, gravida 3, para 2-0-0-  2, who presents at [redacted] weeks gestation.  She has been followed at the  [facility] for women.  The patient has an age greater than 4 and she had an  abnormal quad screen.  She understands the indications for her genetic  amniocentesis and she accepts the risk of, but not limited to,  anesthetic complications, bleeding, infections, possible damage to the  surrounding organs, and possible miscarriage (1:200).   FINDINGS:  An ultrasound was performed that showed a single intrauterine  gestation at a breech presentation.  The amniotic fluid volume was  normal.  The placenta appeared normal.  The fetal heart motions were  normal.  The patient's blood type is O positive.   PROCEDURE:  The patient was seen in the ultrasound suite and an  ultrasound was performed.  An appropriate fluid pocket was identified.  The patient's abdomen was prepped with multiple layers of a non-Betadine  based antiseptic solution.  The abdomen was then sterilely draped.  A 1  mL of 1% Xylocaine was injected directly into the skin.  The spinal  needle was inserted into the amniotic cavity without  difficulty and 18  mL of clear amniotic fluid were removed.  The patient tolerated the  procedure well.  The spinal needle was removed and the ultrasound was  repeated.  There was no evidence of damage to the gestation.  Fetal  heart motions were again noted to be normal.  The patient tolerated her  procedure well.  She was discharged to home in stable condition.  The  amniotic fluid was sent to [facility] for a chromosomal  analysis including a FISH evaluation.      Janine Limbo, M.D.  Electronically Signed     AVS/MEDQ  D:  06/03/2008  T:  06/04/2008  Job:  [id number]

## 2011-03-06 NOTE — H&P (Signed)
[name], [name]                 ACCOUNT NO.:  [id number]   MEDICAL RECORD NO.:  [id number]          PATIENT TYPE:  INP   LOCATION:  9163                          [facility]ACILITY:  [facility]   PHYSICIAN:  Janine Limbo, M.D.DATE OF BIRTH:  [birth year]   DATE OF ADMISSION:  10/13/2008  DATE OF DISCHARGE:                              HISTORY & PHYSICAL   [name] is a 40 year old gravida 3, para 2-0-0-2 at 37 weeks who  presented complaining of spontaneous rupture of membranes since  approximately 2:30 p.m.  She has had some coughing all day with an upper  respiratory issue with some leaking of urine, but has had an increased  gush at 2:30, with continuous leaking since that time of clear fluid.  She reports occasional uterine contractions and reports positive fetal  movement.  Pregnancy has been remarkable for;  1. Group B strep negative.  2. Advanced maternal age with elevated Down syndrome risk by quadruple      screen but normal amnio XX.  3. History of cervical conization  4. Decreased BMI.  5. First trimester spotting.  6. History of left ovarian cyst approximately 5.6 cm, stable since 24      weeks.  7. Migraines.  8. History of rapid labor x2 with her last delivery in 2000, being a      delivery at home secondary to precipitous nature of the labor.  9. Previa which had resolved at approximately 28 weeks.   PRENATAL LABS:  Blood type is O+, Rh antibody negative, VDRL  nonreactive, rubella titer positive, hepatitis B surface antigen  negative, HIV is nonreactive.  Sickle cell test was negative.  GC and  chlamydia cultures were negative her first visit.  Pap was normal in May  2009.  Hemoglobin upon entering the practice was 12.6, it was within  normal limits at 28 weeks.  The patient had a normal Glucola.  She  declined first trimester screen.  She did have a quadruple screen that  showed an elevated Down syndrome risk of 1/237.  She elected amnio.  This showed normal XX female.   Group B strep culture was negative at 36  weeks.   HISTORY OF PRESENT PREGNANCY:  The patient entered care at approximately  12 weeks.  She was unsure about amnio initially.  She declined first  trimester screen.  She did have some intermittent spotting for which she  was on pelvic rest.  She did have a previa noted by approximately 10-  week ultrasound and confirmatory dating of November 03, 2008, for an [facility]  by a 9-5/7 week ultrasound.  She had some headaches in the first  trimester.  She declined amnio again at 16 weeks, but had a quadruple  screen that showed elevated Down syndrome risk of 1/237.  She had a  visit with Dr. Stefano Gaul and elected to proceed with amnio.  At that  time, Dr. Stefano Gaul performed it on June 03, 2008, showing a normal  female.  She had an ultrasound at 19 weeks on June 09, 2008, which  showed normal growth, normal fluid  and a normal anatomy, adjusted risk  was 1/474.  There was a left ovarian cyst 5 cm in size.  It had been 4.4  cm at 10 weeks.  She had some pelvic pressure at 22 weeks.  She was  placed on Macrobid.  At 24 weeks, she had a resolution of placenta  previa to the posterior placenta.  The ovarian cyst with at 5.6 cm,  which was stable.  She had an upper respiratory infection at 28 weeks.  CBC was normal.  She was placed on Tessalon Perles and Zyrtec.  She had  a negative group B strep culture at 36 weeks.  She had a visit earlier  this week on Monday, with cervix 3 cm by Dr. Debria Garret exam.   OBSTETRICAL HISTORY:  In 1999, she had a vaginal delivery of a female  infant, weight 6 pounds plus at 38 weeks.  She was in labor 4 hours.  She had no anesthesia.  In 2000, she had a vaginal delivery of a female  infant, weight 6 pounds at 38 weeks.  She was in labor less than an hour  and delivered precipitously at home.  This pregnancy was with a  different father of the baby and was unplanned.   MEDICAL HISTORY:  She had cervical conization in 1999.   She had an  abnormal Pap in 2005, but then this resolved to normal.  She had  chlamydia at age 62.  She reports usual childhood illnesses.  She had  frequent UTIs before having children.  She does have a history of  migraines and has used Imitrex in the past, but not during the  pregnancy.   SURGICAL HISTORY:  Wisdom teeth in 6.  Her only hospitalization was  for childbirth.   ALLERGIES:  SHE IS SENSITIVE TO FLOXIN WHICH CAUSES GASTROINTESTINAL  UPSET.   FAMILY HISTORY:  Maternal grandmother and father have heart disease.  Her mother, maternal grandmother and paternal aunts have hypertension.  Her uncle had an aneurysm.  Her mother and daughter have asthma.  Maternal aunt and paternal aunt have diabetes.  Maternal aunt has  thyroid disease.  Paternal grandmother has Alzheimer's.  Mother and  maternal mother have migraines.  Her cousin has rheumatoid arthritis.  Her mother and maternal aunt had breast cancer.  Paternal aunt had  breast and ovarian cancer.  Her uncle and cousin smoke and use alcohol.   GENETIC HISTORY:  Remarkable for the patient's age of 82, and the  patient's mother is a twin.   SOCIAL HISTORY:  The patient is divorced.  The father of baby is  supportive, his name is Mayer Camel.  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  Her partner has 4 years of  college, he is in Airline pilot.  She is [place] of the [place]  faith.  She has been followed by the physician service at [facility].  She denies any alcohol, drug or tobacco use during this  pregnancy.   PHYSICAL EXAMINATION:  VITAL SIGNS:  Stable.  The patient is febrile.  HEENT:  Within normal limits.  LUNGS:  Breath sounds are clear.  HEART:  Regular rate and rhythm without murmur.  BREASTS:  Soft and nontender.  ABDOMEN:  Fundal height is approximately 37 cm.  Estimated fetal weight  is right at 6 pounds.  Uterine contractions are 3-5 minutes, mild  quality.  The  patient is known to be leaking clear fluid.  Cervix is 2,  100%, vertex at a -1 station.  Fetal heart rate is reactive with no  decelerations.  EXTREMITIES:  Deep tendon reflexes are 2+ without  clonus.  There is trace edema noted.   IMPRESSION:  1. Intrauterine pregnancy at 37 weeks.  2. Spontaneous rupture of membranes with early labor.  3. Group B strep negative.  4. History of rapid labor.   PLAN:  1. Admit to birthing suite per consult per Dr. Stefano Gaul as attending      physician.  2. Routine physician orders.  3. The patient desires to ambulate at present.  Per Dr. Stefano Gaul, he      will allow this and then asked to be notified in approximately 1      hour for probable Pitocin initiation orders.  4. The patient declines pain medication at this time.      Renaldo Reel Emilee Hero, C.N.M.      Janine Limbo, M.D.  Electronically Signed    VLL/MEDQ  D:  10/13/2008  T:  10/13/2008  Job:  [id number]

## 2011-03-09 NOTE — Op Note (Signed)
[facility]  Patient:    [name]                         MRN: [id number] Proc. Date: 10/02/99 Adm. Date:  [id number] Attending:  Leonard Schwartz                           Operative Report  PREOPERATIVE DIAGNOSIS:       Cervical intraepithelial neoplasia, grade 3.  POSTOPERATIVE DIAGNOSIS:      Cervical intraepithelial neoplasia, grade 3.  PROCEDURE:                    Loop electrical excision procedure.  SURGEON:                      Janine Limbo, M.D.  ANESTHESIA:                   Local 1% Xylocaine.  DISPOSITION:                  [name] is a 40 year old female who was found to have CIN-3 on colposcopically directed biopsies.   She understands the indications for her procedure and she accepts the risks of, but not limited to, anesthetic complications, bleeding, infections and possible damage to the surrounding organs.  FINDINGS:                     The female organs appeared normal.  On colposcopy, the patient was noted to have a small area of white epithelium at the 6 oclock position on the cervix; the transition zone could be seen.  DESCRIPTION OF PROCEDURE:     The patient was taken to the operating room where she was placed in a lithotomy position.  The vagina and the cervix were prepped with multiple layers of acetic acid.  The cervix was injected with 30 cc of a diluted solution of Pitressin, Xylocaine and normal saline.  Ten cc of 1% Xylocaine were then injected directly into the cervix.  Colposcopy was performed.  A loop excision was made and the cone-like specimen was removed.  The specimen was opened at the 3 oclock position and sent to pathology.  Hemostasis was achieved using the ball apparatus of the cautery instrument.  Gelfoam was placed inside the cervix. The patient was returned to the supine position.  She was then taken to the recovery room in stable condition.  The estimated blood loss was 5  cc.  FOLLOWUP INSTRUCTIONS:        The patient will take ibuprofen as needed for pain. She will return to see Dr. Stefano Gaul in two to three weeks for followup examination. She will call for questions or concerns.  She was given a copy of the postoperative instruction sheet as prepared by the [facility] for patients  who have undergone a dilatation and curettage but then had been modified for a oop electrical excision procedure. DD:  10/02/99 TD:  10/02/99 Job: [id number]

## 2011-04-13 ENCOUNTER — Encounter: Payer: Self-pay | Admitting: *Deleted

## 2011-04-13 ENCOUNTER — Encounter: Payer: Self-pay | Admitting: Family Medicine

## 2011-07-20 LAB — TISSUE HYBRIDIZATION TO NCBH

## 2011-07-20 LAB — CHROMOSOME ANALYSIS, AMNIOTIC FLUID (PERF AT WFU)

## 2011-07-27 LAB — CBC
HCT: 28.3 % — ABNORMAL LOW (ref 36.0–46.0)
Hemoglobin: 11.6 g/dL — ABNORMAL LOW (ref 12.0–15.0)
MCV: 97.9 fL (ref 78.0–100.0)
RBC: 2.89 MIL/uL — ABNORMAL LOW (ref 3.87–5.11)
RBC: 3.49 MIL/uL — ABNORMAL LOW (ref 3.87–5.11)
RDW: 14.6 % (ref 11.5–15.5)
WBC: 16 10*3/uL — ABNORMAL HIGH (ref 4.0–10.5)

## 2011-07-27 LAB — RPR: RPR Ser Ql: NONREACTIVE

## 2011-08-23 ENCOUNTER — Other Ambulatory Visit: Payer: Self-pay | Admitting: Family Medicine

## 2011-08-24 NOTE — Telephone Encounter (Signed)
Last OV 08-23-11 unable to see last refill

## 2011-08-25 NOTE — Telephone Encounter (Signed)
Ok to refill for 1 month, 3 refills

## 2011-08-27 MED ORDER — NORTRIPTYLINE HCL 25 MG PO CAPS: 25.0000 mg | ORAL_CAPSULE | Freq: Every day | ORAL | Status: DC

## 2011-08-27 NOTE — Telephone Encounter (Signed)
rx sent to pharmacy by e-script  

## 2011-08-27 NOTE — Telephone Encounter (Signed)
Addended by: Derry Lory A on: 08/27/2011 08:30 AM   Modules accepted: Orders

## 2011-09-03 ENCOUNTER — Telehealth: Payer: Self-pay | Admitting: Family Medicine

## 2011-09-03 NOTE — Telephone Encounter (Signed)
Left message to call office

## 2011-09-05 NOTE — Telephone Encounter (Signed)
Left message to call office

## 2011-09-05 NOTE — Telephone Encounter (Signed)
Pt return call left message to call office advising Pt that OV will be needed please return call.

## 2011-09-06 ENCOUNTER — Ambulatory Visit: Payer: Self-pay | Admitting: Family Medicine

## 2011-09-07 ENCOUNTER — Ambulatory Visit (INDEPENDENT_AMBULATORY_CARE_PROVIDER_SITE_OTHER): Payer: Managed Care, Other (non HMO) | Admitting: Family Medicine

## 2011-09-07 ENCOUNTER — Encounter: Payer: Self-pay | Admitting: Family Medicine

## 2011-09-07 VITALS — BP 145/85 | HR 88 | Temp 98.6°F | Ht 64.5 in | Wt 117.6 lb

## 2011-09-07 DIAGNOSIS — R030 Elevated blood-pressure reading, without diagnosis of hypertension: Secondary | ICD-10-CM

## 2011-09-07 DIAGNOSIS — I10 Essential (primary) hypertension: Secondary | ICD-10-CM | POA: Insufficient documentation

## 2011-09-07 LAB — BASIC METABOLIC PANEL
CO2: 24 mEq/L (ref 19–32)
Calcium: 9.5 mg/dL (ref 8.4–10.5)
Chloride: 106 mEq/L (ref 96–112)
Glucose, Bld: 98 mg/dL (ref 70–99)
Sodium: 141 mEq/L (ref 135–145)

## 2011-09-07 MED ORDER — HYDROCHLOROTHIAZIDE 12.5 MG PO CAPS: 12.5000 mg | ORAL_CAPSULE | Freq: Every day | ORAL | Status: DC

## 2011-09-07 NOTE — Assessment & Plan Note (Signed)
Pt's BP is elevated and has been higher during the previous week.  Has had increased HAs.  Based on this will start low dose HCTZ and monitor closely.  Discussed lifestyle modifications.  Reviewed red flags that should prompt return.  Pt expressed understanding and is in agreement w/ plan.

## 2011-09-07 NOTE — Telephone Encounter (Signed)
Pt has pending OV for today. 

## 2011-09-07 NOTE — Progress Notes (Signed)
  Subjective:    Patient ID: [name], female    DOB: 1971/07/19, 40 y.o.   MRN: [id number]  HPI Elevated BP- reports grandmother has recently been sick, under stress.  Had migraine on Sunday- this was 1st time pt noticed elevated BP.  No CP, SOB, visual changes, edema.  Family hx of HTN.  Denies hx of similar.  Reports she felt poorly last week but did not check BP.  Review of Systems For ROS see HPI     Objective:   Physical Exam  Vitals reviewed. Constitutional: She is oriented to person, place, and time. She appears well-developed and well-nourished. No distress.  HENT:  Head: Normocephalic and atraumatic.  Eyes: Conjunctivae and EOM are normal. Pupils are equal, round, and reactive to light.  Neck: Normal range of motion. Neck supple. No thyromegaly present.  Cardiovascular: Normal rate, regular rhythm, normal heart sounds and intact distal pulses.   No murmur heard. Pulmonary/Chest: Effort normal and breath sounds normal. No respiratory distress.  Abdominal: Soft. She exhibits no distension. There is no tenderness.  Musculoskeletal: She exhibits no edema.  Lymphadenopathy:    She has no cervical adenopathy.  Neurological: She is alert and oriented to person, place, and time.  Skin: Skin is warm and dry.  Psychiatric: She has a normal mood and affect. Her behavior is normal.          Assessment & Plan:

## 2011-09-07 NOTE — Patient Instructions (Signed)
Follow up as scheduled for your complete physical- we'll recheck the blood pressure Start the HCTZ daily for blood pressure Drink plenty of fluids Call with any questions or concerns Hang in there! Happy Holidays!!!

## 2011-09-10 ENCOUNTER — Encounter: Payer: Self-pay | Admitting: *Deleted

## 2011-10-17 ENCOUNTER — Ambulatory Visit: Payer: Self-pay | Admitting: Family Medicine

## 2011-10-22 ENCOUNTER — Ambulatory Visit (INDEPENDENT_AMBULATORY_CARE_PROVIDER_SITE_OTHER): Payer: Managed Care, Other (non HMO) | Admitting: Family Medicine

## 2011-10-22 ENCOUNTER — Encounter: Payer: Self-pay | Admitting: Family Medicine

## 2011-10-22 VITALS — BP 125/85 | HR 81 | Temp 97.6°F | Ht 65.0 in | Wt 115.0 lb

## 2011-10-22 DIAGNOSIS — G43909 Migraine, unspecified, not intractable, without status migrainosus: Secondary | ICD-10-CM

## 2011-10-22 DIAGNOSIS — Z23 Encounter for immunization: Secondary | ICD-10-CM

## 2011-10-22 DIAGNOSIS — Z0000 Encounter for general adult medical examination without abnormal findings: Secondary | ICD-10-CM | POA: Insufficient documentation

## 2011-10-22 LAB — BASIC METABOLIC PANEL
CO2: 27 mEq/L (ref 19–32)
Calcium: 8.9 mg/dL (ref 8.4–10.5)
Creatinine, Ser: 0.5 mg/dL (ref 0.4–1.2)
GFR: 192.63 mL/min (ref >60.00)
Sodium: 139 mEq/L (ref 135–145)

## 2011-10-22 LAB — CBC WITH DIFFERENTIAL/PLATELET
Basophils Absolute: 0 10*3/uL (ref 0.0–0.1)
Eosinophils Absolute: 0.1 10*3/uL (ref 0.0–0.7)
HCT: 40.1 % (ref 36.0–46.0)
Lymphs Abs: 1.3 10*3/uL (ref 0.7–4.0)
MCHC: 34 g/dL (ref 30.0–36.0)
Monocytes Absolute: 0.4 10*3/uL (ref 0.1–1.0)
Monocytes Relative: 8.1 % (ref 3.0–12.0)
Neutro Abs: 2.6 10*3/uL (ref 1.4–7.7)
Platelets: 289 10*3/uL (ref 150.0–400.0)
RDW: 12.8 % (ref 11.5–14.6)

## 2011-10-22 LAB — LIPID PANEL
Cholesterol: 184 mg/dL (ref 0–200)
HDL: 70.1 mg/dL (ref >39.00)
LDL Cholesterol: 105 mg/dL — ABNORMAL HIGH (ref 0–99)
Total CHOL/HDL Ratio: 3
Triglycerides: 46 mg/dL (ref 0.0–149.0)
VLDL: 9.2 mg/dL (ref 0.0–40.0)

## 2011-10-22 LAB — HEPATIC FUNCTION PANEL
Alkaline Phosphatase: 51 U/L (ref 39–117)
Bilirubin, Direct: 0 mg/dL (ref 0.0–0.3)
Total Bilirubin: 0.2 mg/dL — ABNORMAL LOW (ref 0.3–1.2)

## 2011-10-22 NOTE — Patient Instructions (Signed)
Follow up in 6 months to recheck blood pressure You don't have to take the blood pressure medicine We'll notify you of your lab results and make any changes if needed Call with any questions or concerns Keep up the good work! Happy New Year!!!

## 2011-10-22 NOTE — Assessment & Plan Note (Signed)
Pt's PE WNL.  UTD on GYN screening.  Check labs.  Anticipatory guidance provided. 

## 2011-10-22 NOTE — Assessment & Plan Note (Signed)
Refer to neuro- pt desires 2nd opinion (currently seeing Dr Adelene Idler at [facility])

## 2011-10-22 NOTE — Progress Notes (Signed)
  Subjective:    Patient ID: [name], female    DOB: [birth year], 40 y.o.   MRN: [id number]  HPI CPE- has GYN, UTD on pap.  No concerns.   Review of Systems Patient reports no vision/ hearing changes, adenopathy,fever, weight change,  persistant/recurrent hoarseness , swallowing issues, chest pain, palpitations, edema, persistant/recurrent cough, hemoptysis, dyspnea (rest/exertional/paroxysmal nocturnal), gastrointestinal bleeding (melena, rectal bleeding), abdominal pain, significant heartburn, bowel changes, GU symptoms (dysuria, hematuria, incontinence), Gyn symptoms (abnormal  bleeding, pain),  syncope, focal weakness, memory loss, numbness & tingling, skin/hair/nail changes, abnormal bruising or bleeding, anxiety, or depression.     Objective:   Physical Exam General Appearance:    Alert, cooperative, no distress, appears stated age  Head:    Normocephalic, without obvious abnormality, atraumatic  Eyes:    PERRL, conjunctiva/corneas clear, EOM's intact, fundi    benign, both eyes  Ears:    Normal TM's and external ear canals, both ears  Nose:   Nares normal, septum midline, mucosa normal, no drainage    or sinus tenderness  Throat:   Lips, mucosa, and tongue normal; teeth and gums normal  Neck:   Supple, symmetrical, trachea midline, no adenopathy;    Thyroid: no enlargement/tenderness/nodules  Back:     Symmetric, no curvature, ROM normal, no CVA tenderness  Lungs:     Clear to auscultation bilaterally, respirations unlabored  Chest Wall:    No tenderness or deformity   Heart:    Regular rate and rhythm, S1 and S2 normal, no murmur, rub   or gallop  Breast Exam:    Deferred to GYN  Abdomen:     Soft, non-tender, bowel sounds active all four quadrants,    no masses, no organomegaly  Genitalia:    Deferred to GYN  Rectal:    Extremities:   Extremities normal, atraumatic, no cyanosis or edema  Pulses:   2+ and symmetric all extremities  Skin:   Skin color, texture, turgor  normal, no rashes or lesions  Lymph nodes:   Cervical, supraclavicular, and axillary nodes normal  Neurologic:   CNII-XII intact, normal strength, sensation and reflexes    throughout          Assessment & Plan:

## 2011-10-24 ENCOUNTER — Encounter: Payer: Self-pay | Admitting: *Deleted

## 2011-10-25 LAB — VITAMIN D 1,25 DIHYDROXY: Vitamin D2 1, 25 (OH)2: 35 pg/mL

## 2011-11-01 ENCOUNTER — Encounter: Payer: Self-pay | Admitting: *Deleted

## 2011-11-15 ENCOUNTER — Encounter: Payer: Self-pay | Admitting: Family Medicine

## 2011-12-04 ENCOUNTER — Encounter: Payer: Self-pay | Admitting: Neurology

## 2011-12-04 ENCOUNTER — Ambulatory Visit (INDEPENDENT_AMBULATORY_CARE_PROVIDER_SITE_OTHER): Payer: Managed Care, Other (non HMO) | Admitting: Neurology

## 2011-12-04 VITALS — BP 170/94 | HR 84 | Wt 118.0 lb

## 2011-12-04 DIAGNOSIS — G43909 Migraine, unspecified, not intractable, without status migrainosus: Secondary | ICD-10-CM

## 2011-12-04 MED ORDER — NARATRIPTAN HCL 2.5 MG PO TABS: 2.5000 mg | ORAL_TABLET | ORAL | Status: DC | PRN

## 2011-12-04 NOTE — Patient Instructions (Signed)
Follow in three months.

## 2011-12-04 NOTE — Progress Notes (Signed)
Dear Dr. Beverely Low,  Thank you for having me see [name] in consultation today at [facility] for her problem with migraine headaches.  As you may recall, she is a 41 y.o. year old female with a history of migraine headaches for seven years.  These are described as typically at the top of the head although can be lateralized to either frontal area.  They are pounding with photo and phonophobia and nausea and vomiting.  They can be relieved by ibuprofen in she takes it soon after they start.  Imitrex has made her feel unwell.  The nausea  and vomiting are late in the headache.  She has been on nortriptyline as a preventative in the past, but now just uses it as needed for sleep.  She is having 1-2 headaches per month.  They are precipitated by stress.  She has never used another preventative or triptan.  Past Medical History  Diagnosis Date  . Allergic rhinitis   . Migraine   . Eczema     Past Surgical History  Procedure Date  . Laparoscopy     History   Social History  . Marital Status: Divorced    Spouse Name: N/A    Number of Children: N/A  . Years of Education: N/A   Social History Main Topics  . Smoking status: Never Smoker   . Smokeless tobacco: Never Used  . Alcohol Use: No  . Drug Use: No  . Sexually Active: None   Other Topics Concern  . None   Social History Narrative  . None    Family History  Problem Relation Age of Onset  . Breast cancer    . Diabetes    . Hypertension    - mother migraines.  Current Outpatient Prescriptions on File Prior to Visit  Medication Sig Dispense Refill  . nortriptyline (PAMELOR) 25 MG capsule Take 1 capsule (25 mg total) by mouth at bedtime.  30 capsule  3  . promethazine (PHENERGAN) 25 MG tablet Take 25 mg by mouth as needed.          Allergies  Allergen Reactions  . Metronidazole     REACTION: nausea      ROS:  13 systems were reviewed  and  are unremarkable.   Examination:  Filed Vitals:   12/04/11 1521   BP: 170/94  Pulse: 84  Weight: 118 lb (53.524 kg)     In general, well appearing women.  Cardiovascular: The patient has a regular rate and rhythm and no carotid bruits.  Fundoscopy:  Disks are flat. Vessel caliber within normal limits.  Mental status:   The patient is oriented to person, place and time. Recent and remote memory are intact. Attention span and concentration are normal. Language including repetition, naming, following commands are intact. Fund of knowledge of current and historical events, as well as vocabulary are normal.  Cranial Nerves: Pupils are equally round and reactive to light. Visual fields full to confrontation. Extraocular movements are intact without nystagmus. Facial sensation and muscles of mastication are intact. Muscles of facial expression are symmetric. Hearing intact to bilateral finger rub. Tongue protrusion, uvula, palate midline.  Shoulder shrug intact  Motor:  The patient has normal bulk and tone, no pronator drift.  There are no adventitious movements.  5/5 muscle strength bilaterally.  Reflexes:   Biceps  Triceps Brachioradialis Knee Ankle  Right 2+  2+  2+   2+ 2+  Left  2+  2+  2+  2+ 2+  Toes down  Coordination:  Normal finger to nose.  No dysdiadokinesia.  Sensation is intact to temperature and vibration.  Gait and Station are normal.  Tandem gait is intact.  Romberg is negative   Impression: Migraine headaches without aura.   Recommendations: She is not having them often enough to warrant a prophylactic agent.  Naratriptan is available as a generic so I have given her a prescription for that, as I expects its side effects will be less than Imitrex.  I have encouraged her to take the abortive asap after the headache starts - either the naratriptan or ibuprofen.  She can continue to use the phenergan as well.  It does appear her BP is high.  If one was going to start an anti-hypertensive I would choose either verapamil or  propranolol due to the ability for these drugs to prevent migraines.   We will see the patient back in 3 months.  Thank you for having [place] see LADAN VANDERZANDEN in consultation.  Feel free to contact me with any questions.  Lupita Raider Modesto Charon, MD [facility], [place] [address] [place], [place] [address] Phone: [phone] Fax: [phone].

## 2012-01-09 ENCOUNTER — Encounter: Payer: Self-pay | Admitting: Internal Medicine

## 2012-01-09 ENCOUNTER — Ambulatory Visit (INDEPENDENT_AMBULATORY_CARE_PROVIDER_SITE_OTHER): Payer: Managed Care, Other (non HMO) | Admitting: Internal Medicine

## 2012-01-09 DIAGNOSIS — J019 Acute sinusitis, unspecified: Secondary | ICD-10-CM

## 2012-01-09 MED ORDER — FLUTICASONE-SALMETEROL 100-50 MCG/DOSE IN AEPB: 1.0000 | INHALATION_SPRAY | Freq: Two times a day (BID) | RESPIRATORY_TRACT | Status: DC

## 2012-01-09 MED ORDER — AMOXICILLIN 500 MG PO CAPS: 500.0000 mg | ORAL_CAPSULE | Freq: Three times a day (TID) | ORAL | Status: AC

## 2012-01-09 NOTE — Patient Instructions (Signed)
Plain Mucinex for thick secretions ;force NON dairy fluids . Use a Neti pot daily as needed for sinus congestion.  Nasal cleansing in the shower as discussed. Make sure that all residual soap is removed to prevent irritation. Fluticasone  1 spray in each nostril twice a day as needed. Use the "crossover" technique as discussed   

## 2012-01-09 NOTE — Progress Notes (Signed)
  Subjective:    Patient ID: [name], female    DOB: Jan 18, 1971, 41 y.o.   MRN: [id number]  HPI Respiratory tract infection Onset/symptoms:01/04/12 as ST Exposures (illness/environmental/extrinsic):daughter had virus Progression of symptoms:to headache, rhinitis, cough Treatments/response:NSAIDS with some benefit; Sudafed no help Present symptoms: Fever/chills/sweats:no Frontal headache:no Facial pain:no Nasal purulence:yes Sore throat:no Dental pain:yes Lymphadenopathy:no Wheezing/shortness of breath:no Cough/sputum/hemoptysis:no Associated extrinsic/allergic symptoms:itchy eyes/ sneezing:no Past medical history: Seasonal allergies : yes /asthmano Smoking history:never           Review of Systems      Objective:   Physical Exam General appearance:thin but good health ;well nourished; no acute distress or increased work of breathing is present.  No  lymphadenopathy about the head, neck, or axilla noted.   Eyes: No conjunctival inflammation or lid edema is present.   Ears:  External ear exam shows no significant lesions or deformities.  Otoscopic examination reveals clear canals, tympanic membranes are intact bilaterally without bulging, retraction, inflammation or discharge.  Nose:  External nasal examination shows no deformity or inflammation. Nasal mucosa are dry & erythematous without lesions or exudates. No septal dislocation or deviation.No obstruction to airflow. Hyponasal speech   Oral exam: Dental hygiene is good; lips and gums are healthy appearing.There is no oropharyngeal erythema or exudate noted.      Heart:  Normal rate and regular rhythm. S1 and S2 normal without gallop, murmur, click, rub or other extra sounds.   Lungs:Chest clear to auscultation; no wheezes, rhonchi,rales ,or rubs present.No increased work of breathing.    Extremities:  No cyanosis, edema, or clubbing  noted    Skin: Warm & dry          Assessment & Plan:  #1  rhinosinusitis without significant bronchitis  Plan: Nasal hygiene interventions discussed. See prescription medications

## 2012-03-03 ENCOUNTER — Ambulatory Visit: Payer: Managed Care, Other (non HMO) | Admitting: Neurology

## 2012-05-06 ENCOUNTER — Encounter: Payer: Self-pay | Admitting: Neurology

## 2012-05-06 ENCOUNTER — Ambulatory Visit (INDEPENDENT_AMBULATORY_CARE_PROVIDER_SITE_OTHER): Payer: Managed Care, Other (non HMO) | Admitting: Neurology

## 2012-05-06 VITALS — BP 150/90 | HR 80 | Wt 115.0 lb

## 2012-05-06 DIAGNOSIS — G43909 Migraine, unspecified, not intractable, without status migrainosus: Secondary | ICD-10-CM

## 2012-05-06 MED ORDER — NARATRIPTAN HCL 2.5 MG PO TABS: 2.5000 mg | ORAL_TABLET | ORAL | Status: DC | PRN

## 2012-05-06 MED ORDER — KETOROLAC TROMETHAMINE 10 MG PO TABS: 10.0000 mg | ORAL_TABLET | ORAL | Status: DC | PRN

## 2012-05-06 MED ORDER — NORTRIPTYLINE HCL 25 MG PO CAPS: 25.0000 mg | ORAL_CAPSULE | Freq: Every day | ORAL | Status: DC

## 2012-05-06 NOTE — Progress Notes (Signed)
Dear Dr. Beverely Low,  I saw  [name] back in [facility] for her problem with episodic migraine headaches.  As you may recall, she is a 41 y.o. year old female with a history of migraine headaches who was having them about 2 times per month.  I gave her naratriptan 2.5mg  and she reports this is effective at treating her headaches.  She is only taking the nortriptyline infrequently.  She has had only one headache that has not been aborted by the naratriptan because she delayed taking it.  For that she used promethazine and Toradol.  She is having headaches about 1 every 2 weeks.  Otherwise her health has been stable.    Medical history, social history, and family history were reviewed and have not changed since the last clinic visit.  Current Outpatient Prescriptions on File Prior to Visit  Medication Sig Dispense Refill  . Fluticasone-Salmeterol (ADVAIR) 100-50 MCG/DOSE AEPB Inhale 1 puff into the lungs 2 (two) times daily.  14 each  0  . promethazine (PHENERGAN) 25 MG tablet Take 25 mg by mouth as needed.        Marland Kitchen DISCONTD: naratriptan (AMERGE) 2.5 MG tablet Take 1 tablet (2.5 mg total) by mouth as needed for migraine. Take one (1) tablet at onset of headache; if returns or does not resolve, may repeat after 4 hours; do not exceed five (5) mg in 24 hours.  10 tablet  0  . DISCONTD: nortriptyline (PAMELOR) 25 MG capsule Take 1 capsule (25 mg total) by mouth at bedtime.  30 capsule  3    Allergies  Allergen Reactions  . Metronidazole     REACTION: nausea    ROS:  13 systems were reviewed and are unremarkable.  Exam: . Filed Vitals:   05/06/12 1511  BP: 150/90  Pulse: 80  Weight: 115 lb (52.164 kg)    In general, well appearing women.   Cranial Nerves: Pupils are equally round and reactive to light. Visual fields full to confrontation. Extraocular movements are intact without nystagmus. Facial sensation and muscles of mastication are intact. Muscles of facial  expression are symmetric. Hearing intact to bilateral finger rub. Tongue protrusion, uvula, palate midline.  Shoulder shrug intact  Motor:  Normal bulk and tone, no drift and 5/5 muscle strength bilaterally.  Reflexes:  2+ thoughout, toes down.  Coordination:  Normal finger to nose  Gait:  Normal gait and station.  Romberg negative.  Impression/Recommendations:  1. Migraine headaches - She is on the borderline of needing a preventative.  I have encouraged her to take her Pamelor nightly to see if this lessens her frequency.  For abortive I have renewed her naratriptan 2.5mg .  In addition, I have given her a script for ketorolac 10-20mg  prn severe intractable headache.  I note that her baseline Cr taken in December is normal.    I have asked her to only use the ketorolac infrequently no more than once per month.  If her headaches get more frequent I would increase her Pamelor.   She will return to follow up with you.  Lupita Raider Modesto Charon, MD [facility], [place]

## 2012-05-14 ENCOUNTER — Encounter: Payer: Self-pay | Admitting: Obstetrics and Gynecology

## 2012-08-15 ENCOUNTER — Telehealth: Payer: Self-pay | Admitting: Family Medicine

## 2012-08-15 MED ORDER — NORTRIPTYLINE HCL 25 MG PO CAPS: 25.0000 mg | ORAL_CAPSULE | Freq: Every day | ORAL | Status: DC

## 2012-08-15 NOTE — Telephone Encounter (Signed)
Refill: Nortriptyline 25 mg capsules. Take 1 capsule by mouth daily at bedtime. Qty 30. Last fill 9.9.13

## 2012-08-15 NOTE — Telephone Encounter (Signed)
Ok for #30 w/ 3 but please ask if she's still seeing Dr Modesto Charon

## 2012-08-15 NOTE — Telephone Encounter (Signed)
rx sent to pharmacy by e-script Left message to clarify if she still see's MD Modesto Charon

## 2012-08-15 NOTE — Telephone Encounter (Signed)
Last OV 10-22-11 last refill filled by MD Modesto Charon on 05-06-12 # 30 with 3 refills please advise

## 2012-08-21 ENCOUNTER — Ambulatory Visit (INDEPENDENT_AMBULATORY_CARE_PROVIDER_SITE_OTHER): Payer: Managed Care, Other (non HMO) | Admitting: Obstetrics and Gynecology

## 2012-08-21 ENCOUNTER — Encounter: Payer: Self-pay | Admitting: Obstetrics and Gynecology

## 2012-08-21 VITALS — BP 118/84 | HR 92 | Ht 64.0 in | Wt 122.0 lb

## 2012-08-21 DIAGNOSIS — N949 Unspecified condition associated with female genital organs and menstrual cycle: Secondary | ICD-10-CM

## 2012-08-21 DIAGNOSIS — Z124 Encounter for screening for malignant neoplasm of cervix: Secondary | ICD-10-CM

## 2012-08-21 DIAGNOSIS — N926 Irregular menstruation, unspecified: Secondary | ICD-10-CM

## 2012-08-21 DIAGNOSIS — Z01419 Encounter for gynecological examination (general) (routine) without abnormal findings: Secondary | ICD-10-CM

## 2012-08-21 DIAGNOSIS — N898 Other specified noninflammatory disorders of vagina: Secondary | ICD-10-CM

## 2012-08-21 LAB — POCT WET PREP (WET MOUNT)
Whiff Test: POSITIVE
pH: 5.5

## 2012-08-21 LAB — POCT URINE PREGNANCY: Preg Test, Ur: NEGATIVE

## 2012-08-21 MED ORDER — AMBULATORY NON FORMULARY MEDICATION: 600.0000 mg | Status: AC

## 2012-08-21 MED ORDER — AMBULATORY NON FORMULARY MEDICATION: 600.0000 mg | Status: DC

## 2012-08-21 MED ORDER — METRONIDAZOLE 0.75 % VA GEL: VAGINAL | Status: DC

## 2012-08-21 NOTE — Patient Instructions (Addendum)
Avoid: - excess soap on genital area (consider using plain oatmeal soap) - use of powder or sprays in genital area - douching - wearing underwear to bed (except with menses) - using more than is directed detergent when washing clothes - tight fitting garments around genital area - excess sugar intake  Bacterial Vaginosis Bacterial vaginosis (BV) is a vaginal infection where the normal balance of bacteria in the vagina is disrupted. The normal balance is then replaced by an overgrowth of certain bacteria. There are several different kinds of bacteria that can cause BV. BV is the most common vaginal infection in women of childbearing age. CAUSES   The cause of BV is not fully understood. BV develops when there is an increase or imbalance of harmful bacteria.  Some activities or behaviors can upset the normal balance of bacteria in the vagina and put women at increased risk including:  Having a new sex partner or multiple sex partners.  Douching.  Using an intrauterine device (IUD) for contraception.  It is not clear what role sexual activity plays in the development of BV. However, women that have never had sexual intercourse are rarely infected with BV. Women do not get BV from toilet seats, bedding, swimming pools or from touching objects around them.  SYMPTOMS   Grey vaginal discharge.  A fish-like odor with discharge, especially after sexual intercourse.  Itching or burning of the vagina and vulva.  Burning or pain with urination.  Some women have no signs or symptoms at all. DIAGNOSIS  Your caregiver must examine the vagina for signs of BV. Your caregiver will perform lab tests and look at the sample of vaginal fluid through a microscope. They will look for bacteria and abnormal cells (clue cells), a pH test higher than 4.5, and a positive amine test all associated with BV.  RISKS AND COMPLICATIONS   Pelvic inflammatory disease (PID).  Infections following gynecology  surgery.  Developing HIV.  Developing herpes virus. TREATMENT  Sometimes BV will clear up without treatment. However, all women with symptoms of BV should be treated to avoid complications, especially if gynecology surgery is planned. Female partners generally do not need to be treated. However, BV may spread between female sex partners so treatment is helpful in preventing a recurrence of BV.   BV may be treated with antibiotics. The antibiotics come in either pill or vaginal cream forms. Either can be used with nonpregnant or pregnant women, but the recommended dosages differ. These antibiotics are not harmful to the baby.  BV can recur after treatment. If this happens, a second round of antibiotics will often be prescribed.  Treatment is important for pregnant women. If not treated, BV can cause a premature delivery, especially for a pregnant woman who had a premature birth in the past. All pregnant women who have symptoms of BV should be checked and treated.  For chronic reoccurrence of BV, treatment with a type of prescribed gel vaginally twice a week is helpful. HOME CARE INSTRUCTIONS   Finish all medication as directed by your caregiver.  Do not have sex until treatment is completed.  Tell your sexual partner that you have a vaginal infection. They should see their caregiver and be treated if they have problems, such as a mild rash or itching.  Practice safe sex. Use condoms. Only have 1 sex partner. PREVENTION  Basic prevention steps can help reduce the risk of upsetting the natural balance of bacteria in the vagina and developing BV:  Do not  have sexual intercourse (be abstinent).  Do not douche.  Use all of the medicine prescribed for treatment of BV, even if the signs and symptoms go away.  Tell your sex partner if you have BV. That way, they can be treated, if needed, to prevent reoccurrence. SEEK MEDICAL CARE IF:   Your symptoms are not improving after 3 days of  treatment.  You have increased discharge, pain, or fever. MAKE SURE YOU:   Understand these instructions.  Will watch your condition.  Will get help right away if you are not doing well or get worse. FOR MORE INFORMATION  Division of STD Prevention (DSTDP), Centers for Disease Control and Prevention: SolutionApps.co.za American Social Health Association (ASHA): www.ashastd.org  Document Released: 10/08/2005 Document Revised: 12/31/2011 Document Reviewed: 03/31/2009 [facility] Patient Information 2013 [place], [place].  [facility] pharmacies that carry Boric Acid Capsules/Suppositories:  [facility] [address] (only)  [phone]  [facility]  [address] [facility])  [phone]  [facility])  [address] [phone]  [facility]  [address] [phone]  [facility] [address], [place], [place] [phone]

## 2012-08-21 NOTE — Progress Notes (Signed)
Last Pap: 06/11/11 WNL: Yes Regular Periods:no Contraception: BTL  Monthly Breast exam:no Tetanus<11yrs:? Nl.Bladder Function:yes Daily BMs:no Healthy Diet:yes Calcium:no Mammogram:yes Date of Mammogram: 04/2012-wnl per pt @ [facility] Exercise:no Have often Exercise:  Seatbelt: yes Abuse at home: no Stressful work:no Sigmoid-colonoscopy: n/a Bone Density: Yes 2011 or 2012-wnl per pt PCP: Dr.Tabori Change in PMH: no change  Change in FMH:no change BMI=21  Color: white Odor: yes Itching:no Thin:yes Thick:no Fever:no Dyspareunia:no Hx PID:no HX STD:no Pelvic Pain:no Desires Gc/CT:no Desires HIV,RPR,HbsAG:no

## 2012-08-21 NOTE — Progress Notes (Signed)
Subjective:    [name] is a 41 y.o. female, G3P3, who presents for an annual exam. The patient reports malodorous vaginal discharge off and on for several weeks.  Admits to mild itching. Denies urinary or bowel issues.  Menstrual cycle:   LMP: Patient's last menstrual period was 06/27/2012.             Review of Systems Pertinent items are noted in HPI. Denies pelvic pain, urinary tract symptoms, vaginitis symptoms, irregular bleeding, menopausal symptoms, change in bowel habits or rectal bleeding   Objective:    BP 118/84  Pulse 92  Ht 5\' 4"  (1.626 m)  Wt 122 lb (55.339 kg)  BMI 20.94 kg/m2  LMP 06/27/2012   Wt Readings from Last 1 Encounters:  08/21/12 122 lb (55.339 kg)   Body mass index is 20.94 kg/(m^2). General Appearance: Alert, no acute distress HEENT: Grossly normal Neck / Thyroid: Supple, no thyromegaly or cervical adenopathy Lungs: Clear to auscultation bilaterally Back: No CVA tenderness Breast Exam: No masses or nodes.No dimpling, nipple retraction or discharge. Cardiovascular: Regular rate and rhythm.  Gastrointestinal: Soft, non-tender, no masses or organomegaly Pelvic Exam: EGBUS-wnl, vagina-normal rugae, cervix- without lesions or tenderness, uterus appears normal size shape and consistency, adnexae-no masses or tenderness Rectovaginal: no masses and normal sphincter tone Lymphatic Exam: Non-palpable nodes in neck, clavicular,  axillary, or inguinal regions  Skin: no rashes or abnormalities Extremities: no clubbing cyanosis or edema  Neurologic: grossly normal Psychiatric: Alert and oriented  Wet Prep: pH-5.5,  whiff-positive,  many clue cells   Assessment:   Routine GYN Exam Bacterial  Vaginosis   Plan:  Metrogel Vaginal  #1 tube  1 applicatorful pv qd x 5 days  PAP sent  Boric Acid Suppositories 600 mg  #30 1 pv twice weekly x 4 weeks then prn 11 refills  RTO 1 year or prn  [name],ELMIRAPA-C

## 2012-08-22 LAB — PAP IG W/ RFLX HPV ASCU

## 2012-10-02 ENCOUNTER — Ambulatory Visit (INDEPENDENT_AMBULATORY_CARE_PROVIDER_SITE_OTHER): Payer: Managed Care, Other (non HMO) | Admitting: Family Medicine

## 2012-10-02 ENCOUNTER — Encounter: Payer: Self-pay | Admitting: Family Medicine

## 2012-10-02 VITALS — BP 160/108 | HR 79 | Temp 98.1°F | Ht 63.75 in | Wt 120.8 lb

## 2012-10-02 DIAGNOSIS — I10 Essential (primary) hypertension: Secondary | ICD-10-CM

## 2012-10-02 DIAGNOSIS — Z0000 Encounter for general adult medical examination without abnormal findings: Secondary | ICD-10-CM

## 2012-10-02 DIAGNOSIS — R030 Elevated blood-pressure reading, without diagnosis of hypertension: Secondary | ICD-10-CM

## 2012-10-02 DIAGNOSIS — Z23 Encounter for immunization: Secondary | ICD-10-CM

## 2012-10-02 LAB — LIPID PANEL
LDL Cholesterol: 104 mg/dL — ABNORMAL HIGH (ref 0–99)
Total CHOL/HDL Ratio: 3
VLDL: 7.2 mg/dL (ref 0.0–40.0)

## 2012-10-02 LAB — HEPATIC FUNCTION PANEL
ALT: 14 U/L (ref 0–35)
AST: 19 U/L (ref 0–37)
Alkaline Phosphatase: 49 U/L (ref 39–117)
Bilirubin, Direct: 0 mg/dL (ref 0.0–0.3)
Total Bilirubin: 0.6 mg/dL (ref 0.3–1.2)
Total Protein: 7.6 g/dL (ref 6.0–8.3)

## 2012-10-02 LAB — CBC WITH DIFFERENTIAL/PLATELET
Basophils Absolute: 0 10*3/uL (ref 0.0–0.1)
Eosinophils Absolute: 0 10*3/uL (ref 0.0–0.7)
Hemoglobin: 13.8 g/dL (ref 12.0–15.0)
Lymphocytes Relative: 29.8 % (ref 12.0–46.0)
MCHC: 34.4 g/dL (ref 30.0–36.0)
Monocytes Relative: 7.6 % (ref 3.0–12.0)
Neutrophils Relative %: 61.8 % (ref 43.0–77.0)
RBC: 4.24 Mil/uL (ref 3.87–5.11)
RDW: 12.6 % (ref 11.5–14.6)

## 2012-10-02 LAB — BASIC METABOLIC PANEL
CO2: 26 mEq/L (ref 19–32)
Chloride: 105 mEq/L (ref 96–112)
Potassium: 3.3 mEq/L — ABNORMAL LOW (ref 3.5–5.1)

## 2012-10-02 MED ORDER — LISINOPRIL-HYDROCHLOROTHIAZIDE 10-12.5 MG PO TABS: 1.0000 | ORAL_TABLET | Freq: Every day | ORAL | Status: DC

## 2012-10-02 NOTE — Progress Notes (Signed)
  Subjective:    Patient ID: [name], female    DOB: 06-23-1971, 42 y.o.   MRN: [id number]  HPI HTN- elevated today.  Not currently on meds.  On HCTZ in the past.  No HAs, visual changes, CP, SOB, edema, N/V.  + family hx of HTN.  Pt admits to increased stress.   Review of Systems For ROS see HPI     Objective:   Physical Exam  Vitals reviewed. Constitutional: She is oriented to person, place, and time. She appears well-developed and well-nourished. No distress.  HENT:  Head: Normocephalic and atraumatic.  Eyes: Conjunctivae normal and EOM are normal. Pupils are equal, round, and reactive to light.  Neck: Normal range of motion. Neck supple. No thyromegaly present.  Cardiovascular: Normal rate, regular rhythm, normal heart sounds and intact distal pulses.   No murmur heard. Pulmonary/Chest: Effort normal and breath sounds normal. No respiratory distress.  Abdominal: Soft. She exhibits no distension. There is no tenderness.  Musculoskeletal: She exhibits no edema.  Lymphadenopathy:    She has no cervical adenopathy.  Neurological: She is alert and oriented to person, place, and time.  Skin: Skin is warm and dry.  Psychiatric: She has a normal mood and affect. Her behavior is normal.          Assessment & Plan:

## 2012-10-02 NOTE — Assessment & Plan Note (Signed)
New.  Pt now officially hypertensive.  Has been on HCTZ previously.  Restart and add ACE.  Check labs.  Will follow closely.  Pt expressed understanding and is in agreement w/ plan.

## 2012-10-02 NOTE — Patient Instructions (Addendum)
Follow up as scheduled for your physical exam Start the Lisinopril HCTZ daily for BP Try and limit your salt intake Keep up the good work! Happy Holidays!!

## 2012-12-02 ENCOUNTER — Encounter: Payer: Self-pay | Admitting: Family Medicine

## 2012-12-02 ENCOUNTER — Telehealth: Payer: Self-pay | Admitting: *Deleted

## 2012-12-02 ENCOUNTER — Ambulatory Visit (INDEPENDENT_AMBULATORY_CARE_PROVIDER_SITE_OTHER): Payer: Managed Care, Other (non HMO) | Admitting: Family Medicine

## 2012-12-02 VITALS — BP 140/90 | HR 73 | Temp 98.1°F | Ht 65.0 in | Wt 119.0 lb

## 2012-12-02 DIAGNOSIS — Z0000 Encounter for general adult medical examination without abnormal findings: Secondary | ICD-10-CM

## 2012-12-02 DIAGNOSIS — I10 Essential (primary) hypertension: Secondary | ICD-10-CM

## 2012-12-02 LAB — BASIC METABOLIC PANEL
BUN: 10 mg/dL (ref 6–23)
GFR: 118.02 mL/min (ref >60.00)
Potassium: 3.1 mEq/L — ABNORMAL LOW (ref 3.5–5.1)
Sodium: 137 mEq/L (ref 135–145)

## 2012-12-02 MED ORDER — LOSARTAN POTASSIUM-HCTZ 100-12.5 MG PO TABS: 1.0000 | ORAL_TABLET | Freq: Every day | ORAL | Status: DC

## 2012-12-02 MED ORDER — POTASSIUM CHLORIDE CRYS ER 20 MEQ PO TBCR: 20.0000 meq | EXTENDED_RELEASE_TABLET | Freq: Every day | ORAL | Status: AC

## 2012-12-02 MED ORDER — BENZONATATE 200 MG PO CAPS: 200.0000 mg | ORAL_CAPSULE | Freq: Three times a day (TID) | ORAL | Status: DC | PRN

## 2012-12-02 NOTE — Progress Notes (Signed)
  Subjective:    Patient ID: [name], female    DOB: October 31, 1970, 42 y.o.   MRN: [id number]  HPI CPE- UTD on mammo, pap w/ Dr Stefano Gaul.  + dry cough x1 month- on Lisinopril.   Review of Systems Patient reports no vision/ hearing changes, adenopathy,fever, weight change,  persistant/recurrent hoarseness , swallowing issues, chest pain, palpitations, edema, hemoptysis, dyspnea (rest/exertional/paroxysmal nocturnal), gastrointestinal bleeding (melena, rectal bleeding), abdominal pain, significant heartburn, bowel changes, GU symptoms (dysuria, hematuria, incontinence), Gyn symptoms (abnormal  bleeding, pain),  syncope, focal weakness, memory loss, numbness & tingling, skin/hair/nail changes, abnormal bruising or bleeding, anxiety, or depression.     Objective:   Physical Exam General Appearance:    Alert, cooperative, no distress, appears stated age  Head:    Normocephalic, without obvious abnormality, atraumatic  Eyes:    PERRL, conjunctiva/corneas clear, EOM's intact, fundi    benign, both eyes  Ears:    Normal TM's and external ear canals, both ears  Nose:   Nares normal, septum midline, mucosa normal, no drainage    or sinus tenderness  Throat:   Lips, mucosa, and tongue normal; teeth and gums normal  Neck:   Supple, symmetrical, trachea midline, no adenopathy;    Thyroid: no enlargement/tenderness/nodules  Back:     Symmetric, no curvature, ROM normal, no CVA tenderness  Lungs:     Clear to auscultation bilaterally, respirations unlabored- dry cough  Chest Wall:    No tenderness or deformity   Heart:    Regular rate and rhythm, S1 and S2 normal, no murmur, rub   or gallop  Breast Exam:    Deferred to GYN  Abdomen:     Soft, non-tender, bowel sounds active all four quadrants,    no masses, no organomegaly  Genitalia:    Deferred to GYN  Rectal:    Extremities:   Extremities normal, atraumatic, no cyanosis or edema  Pulses:   2+ and symmetric all extremities  Skin:   Skin  color, texture, turgor normal, no rashes or lesions  Lymph nodes:   Cervical, supraclavicular, and axillary nodes normal  Neurologic:   CNII-XII intact, normal strength, sensation and reflexes    throughout          Assessment & Plan:

## 2012-12-02 NOTE — Assessment & Plan Note (Signed)
Chronic problem, well controlled today.  Having dry cough on Lisinopril.  Will switch to ARB and follow BP closely.  Pt expressed understanding and is in agreement w/ plan.

## 2012-12-02 NOTE — Assessment & Plan Note (Signed)
Pt's PE WNL.  UTD on GYN.  Reviewed labs from last OV.  Anticipatory guidance provided.

## 2012-12-02 NOTE — Telephone Encounter (Signed)
Message copied by Verdene Rio on Tue Dec 02, 2012  5:55 PM ------      Message from: Sheliah Hatch      Created: Tue Dec 02, 2012  3:22 PM       K+ is lower than previous.  Needs to start Kdur daily.  #30, 11 refills ------

## 2012-12-02 NOTE — Patient Instructions (Addendum)
Follow up in 1-2 months to recheck BP STOP the lisinopril START the Hyzaar (losartan HCTZ combo) Use the Tessalon as needed for cough We'll notify you of your lab results Call with any questions or concerns HAPPY BIRTHDAY!!!

## 2012-12-02 NOTE — Telephone Encounter (Signed)
Rx sent, Discuss with patient  

## 2012-12-08 ENCOUNTER — Telehealth: Payer: Self-pay | Admitting: Family Medicine

## 2012-12-08 NOTE — Telephone Encounter (Signed)
LM ON triage line [id number], pt stated when she was in for her CPE she had a medication change that she thinks is making her sick. CB# [phone] Pt did not leave the name of the medication just that she got a 90-day supply

## 2012-12-09 NOTE — Telephone Encounter (Signed)
Spoke with the pt and she stated she was put no a new med for her BP(from the office note she was changed to Hyzaar 100-12.5mg  daily), and she said she took it the first day and it made her nauseated(and she had eaten), but when she took it the 2nd day she did not get nauseated.  The pt stated she stopped the med after the 2nd day.  Informed the pt that sometimes it takes the body alittle while to get use to the new med, and maybe she should try to take it alittle longer.  Told the pt if she continues with the med and she continues to have the nausea please give [place] a call back.  The pt understood and agreed.//AB/CMA

## 2012-12-09 NOTE — Telephone Encounter (Signed)
Agree w/ advice given 

## 2012-12-09 NOTE — Telephone Encounter (Signed)
LM @ (1:36pm) asking the pt to RTC.//AB/CMA

## 2012-12-25 ENCOUNTER — Telehealth: Payer: Self-pay | Admitting: Family Medicine

## 2012-12-25 NOTE — Telephone Encounter (Signed)
Please advise 

## 2012-12-25 NOTE — Telephone Encounter (Signed)
new meds are still making her sick stated she was give losartan and is no better would like something else that will n ot make her sick. she will have to call you back on her next break at 4:15pm cb# [phone]

## 2012-12-27 NOTE — Telephone Encounter (Signed)
Can stop losartan HCTZ combo and switch to valsartan 160mg  daily, #30, 3 refills.  Needs BP check in 3 weeks

## 2012-12-29 ENCOUNTER — Ambulatory Visit (INDEPENDENT_AMBULATORY_CARE_PROVIDER_SITE_OTHER): Payer: Managed Care, Other (non HMO) | Admitting: Family Medicine

## 2012-12-29 ENCOUNTER — Encounter: Payer: Self-pay | Admitting: Family Medicine

## 2012-12-29 VITALS — BP 140/90 | HR 78 | Temp 98.1°F | Ht 65.0 in | Wt 120.2 lb

## 2012-12-29 DIAGNOSIS — G43909 Migraine, unspecified, not intractable, without status migrainosus: Secondary | ICD-10-CM

## 2012-12-29 DIAGNOSIS — I10 Essential (primary) hypertension: Secondary | ICD-10-CM

## 2012-12-29 MED ORDER — NEBIVOLOL HCL 10 MG PO TABS: 10.0000 mg | ORAL_TABLET | Freq: Every day | ORAL | Status: DC

## 2012-12-29 MED ORDER — KETOROLAC TROMETHAMINE 60 MG/2ML IM SOLN
60.0000 mg | Freq: Once | INTRAMUSCULAR | Status: AC
  Administered 2012-12-29: 60 mg via INTRAMUSCULAR

## 2012-12-29 MED ORDER — NARATRIPTAN HCL 2.5 MG PO TABS: 2.5000 mg | ORAL_TABLET | ORAL | Status: DC | PRN

## 2012-12-29 MED ORDER — KETOROLAC TROMETHAMINE 10 MG PO TABS: 10.0000 mg | ORAL_TABLET | ORAL | Status: AC | PRN

## 2012-12-29 MED ORDER — PROMETHAZINE HCL 25 MG PO TABS: 25.0000 mg | ORAL_TABLET | Freq: Three times a day (TID) | ORAL | Status: DC | PRN

## 2012-12-29 NOTE — Telephone Encounter (Signed)
Pt was seen in the office today by Dr. Beverely Low, and BP med was changed.//AB/CMA

## 2012-12-29 NOTE — Patient Instructions (Addendum)
Follow up in 3-4 weeks to recheck BP Start the Bystolic daily No toradol today- the injection will take care of it Take the Amerge when you get home Use the phenergan as needed for nausea Call with any questions or concerns Hang in there!!!

## 2012-12-29 NOTE — Progress Notes (Signed)
  Subjective:    Patient ID: [name], female    DOB: 1971-08-21, 42 y.o.   MRN: [id number]  HPI Migraine- started this AM, hx of similar.  HA is bandlike across eyes and 'throbbing on top of my head'.  Ran out of triptan.  Took 2 tylenol w/ mild and temporary relief.  + nausea.  + photo and phonophobia.  Taking Pamelor nightly.  Pt drove herself here today.  HTN- losartan HCTZ continues to make pt sick to stomach.  Not currently taking BP med.  Denies CP, SOB, HAs, visual changes, edema.   Review of Systems For ROS see HPI     Objective:   Physical Exam  Vitals reviewed. Constitutional: She is oriented to person, place, and time. She appears well-developed and well-nourished. No distress.  HENT:  Head: Normocephalic and atraumatic.  TMs WNL No TTP over sinuses Minimal nasal congestion  Eyes: Conjunctivae and EOM are normal. Pupils are equal, round, and reactive to light.  Neck: Normal range of motion. Neck supple.  Cardiovascular: Normal rate, regular rhythm, normal heart sounds and intact distal pulses.   Pulmonary/Chest: Effort normal and breath sounds normal. No respiratory distress. She has no wheezes. She has no rales.  Lymphadenopathy:    She has no cervical adenopathy.  Neurological: She is alert and oriented to person, place, and time. She has normal reflexes. No cranial nerve deficit. Coordination normal.  Psychiatric: She has a normal mood and affect. Her behavior is normal. Judgment and thought content normal.          Assessment & Plan:

## 2012-12-30 ENCOUNTER — Telehealth: Payer: Self-pay | Admitting: *Deleted

## 2012-12-30 ENCOUNTER — Telehealth: Payer: Self-pay | Admitting: Family Medicine

## 2012-12-30 NOTE — Assessment & Plan Note (Signed)
Chronic problem, stopped ARB due to side effects.  Start Bystolic samples to not only tx BP but possibly as a migraine preventative.  Will follow closely.

## 2012-12-30 NOTE — Telephone Encounter (Signed)
Called patient to make her aware that the samples of bystolic she is requesting is available for p/u.

## 2012-12-30 NOTE — Telephone Encounter (Signed)
Patient is requesting samples of bystolic 10mg 

## 2012-12-30 NOTE — Assessment & Plan Note (Signed)
Pt w/ hx of similar, previously seeing neuro.  Ran out of triptan, toradol.  Refilled both.  Continue nightly Pamelor.  toradol injxn given in office.  Reviewed supportive care and red flags that should prompt return.  Pt expressed understanding and is in agreement w/ plan.

## 2012-12-31 ENCOUNTER — Ambulatory Visit: Payer: Managed Care, Other (non HMO) | Admitting: Family Medicine

## 2013-01-09 ENCOUNTER — Telehealth: Payer: Self-pay | Admitting: Family Medicine

## 2013-01-09 NOTE — Telephone Encounter (Signed)
Patient calling, state she dropped off FMLA paperwork about 2 weeks ago, she thinks on Monday 12/29/12, to be completed.  She is asking the status.  Please notify patient of status.

## 2013-01-12 NOTE — Telephone Encounter (Signed)
I located patient's fmla paperwork at the front desk.  Paperwork had already been completed and faxed back to [place].  I have left message for patient informing her of above, and also told her I would leave her a copy at the front desk for her to pick up if she wishes.

## 2013-01-29 ENCOUNTER — Encounter: Payer: Self-pay | Admitting: Family Medicine

## 2013-01-29 ENCOUNTER — Ambulatory Visit (INDEPENDENT_AMBULATORY_CARE_PROVIDER_SITE_OTHER): Payer: Managed Care, Other (non HMO) | Admitting: Family Medicine

## 2013-01-29 VITALS — BP 150/94 | HR 62 | Temp 98.2°F | Ht 65.0 in | Wt 119.4 lb

## 2013-01-29 DIAGNOSIS — H00013 Hordeolum externum right eye, unspecified eyelid: Secondary | ICD-10-CM

## 2013-01-29 DIAGNOSIS — H00019 Hordeolum externum unspecified eye, unspecified eyelid: Secondary | ICD-10-CM

## 2013-01-29 DIAGNOSIS — I10 Essential (primary) hypertension: Secondary | ICD-10-CM

## 2013-01-29 MED ORDER — CEPHALEXIN 500 MG PO CAPS: 500.0000 mg | ORAL_CAPSULE | Freq: Two times a day (BID) | ORAL | Status: AC

## 2013-01-29 NOTE — Assessment & Plan Note (Signed)
Chronic problem.  Not well controlled today but pt has yet to take meds.  Reports she is not having side effects like she did w/ losartan.  Will follow closely.

## 2013-01-29 NOTE — Progress Notes (Signed)
  Subjective:    Patient ID: [name], female    DOB: August 24, 1971, 42 y.o.   MRN: [id number]  HPI HTN- chronic problem, BP meds were switched at last visit b/c Losartan was making her feel poorly and she wasn't taking it.  BP higher today than previous.  Pt reports she hasn't taken BP med yet today.  Not checking BP at home.  No CP, SOB, HAs, visual changes, edema.  No side effects from meds.  R eye swelling- started itching on Monday, yesterday woke up w/ red, swollen eye.  Mildly painful.  Hx of stye.   Review of Systems For ROS see HPI     Objective:   Physical Exam  Vitals reviewed. Constitutional: She is oriented to person, place, and time. She appears well-developed and well-nourished. No distress.  HENT:  Head: Normocephalic and atraumatic.  Eyes: Conjunctivae and EOM are normal. Pupils are equal, round, and reactive to light.  R upper lid stye along lash line medially Upper lid erythematous, warm, swollen  Neck: Normal range of motion. Neck supple. No thyromegaly present.  Cardiovascular: Normal rate, regular rhythm, normal heart sounds and intact distal pulses.   No murmur heard. Pulmonary/Chest: Effort normal and breath sounds normal. No respiratory distress.  Abdominal: Soft. She exhibits no distension. There is no tenderness.  Musculoskeletal: She exhibits no edema.  Lymphadenopathy:    She has no cervical adenopathy.  Neurological: She is alert and oriented to person, place, and time.  Skin: Skin is warm and dry.  Psychiatric: She has a normal mood and affect. Her behavior is normal.          Assessment & Plan:

## 2013-01-29 NOTE — Patient Instructions (Addendum)
Follow up in 3-4 weeks to recheck BP (afternoon appt to better assess BP) Start the keflex twice daily x5 days for the cellulitis of the eyelid Hot compresses to the eye Continue the Bystolic  Call with any questions or concerns Happy Spring!

## 2013-01-29 NOTE — Assessment & Plan Note (Signed)
New to provider.  Start keflex in case of upper eyelid cellulitis.  Reviewed supportive care and red flags that should prompt return.  Pt expressed understanding and is in agreement w/ plan.

## 2013-01-30 ENCOUNTER — Telehealth: Payer: Self-pay | Admitting: Family Medicine

## 2013-01-30 ENCOUNTER — Ambulatory Visit: Payer: Managed Care, Other (non HMO) | Admitting: Family Medicine

## 2013-01-30 MED ORDER — NEBIVOLOL HCL 10 MG PO TABS: 10.0000 mg | ORAL_TABLET | Freq: Every day | ORAL | Status: DC

## 2013-01-30 NOTE — Telephone Encounter (Signed)
Patient called requesting samples of bystolic. CB# [phone]

## 2013-01-30 NOTE — Telephone Encounter (Signed)
Discuss with patient, Rx sent. 

## 2013-01-30 NOTE — Telephone Encounter (Signed)
Ok for 1 month of samples- can pick up Monday.  Needs to pick up meds at pharmacy so she has BP meds over the weekend.

## 2013-03-11 ENCOUNTER — Ambulatory Visit (INDEPENDENT_AMBULATORY_CARE_PROVIDER_SITE_OTHER): Payer: Managed Care, Other (non HMO) | Admitting: Family Medicine

## 2013-03-11 ENCOUNTER — Encounter: Payer: Self-pay | Admitting: Family Medicine

## 2013-03-11 VITALS — BP 150/90 | HR 68 | Temp 98.2°F | Ht 65.0 in | Wt 121.8 lb

## 2013-03-11 DIAGNOSIS — I10 Essential (primary) hypertension: Secondary | ICD-10-CM

## 2013-03-11 MED ORDER — NEBIVOLOL HCL 20 MG PO TABS: 1.0000 | ORAL_TABLET | Freq: Every day | ORAL | Status: DC

## 2013-03-11 NOTE — Assessment & Plan Note (Signed)
Unchanged.  BP not well controlled.  Asymptomatic.  Increase to bystolic 20mg  daily.  Follow BP closely.  Reviewed lifestyle modifications that will improve BP.  Pt expressed understanding and is in agreement w/ plan.

## 2013-03-11 NOTE — Patient Instructions (Addendum)
Schedule a nurse visit in 4-6 weeks to recheck BP Increase the Bystolic to 20mg - 2 tabs of what you have and 1 of the new script Call with any questions or concerns Hang in there!

## 2013-03-11 NOTE — Progress Notes (Signed)
  Subjective:    Patient ID: [name], female    DOB: 09-01-1971, 42 y.o.   MRN: [id number]  HPI HTN- chronic problem, on Bystolic.  BP elevated today.  'today hasn't been a good day'.  No CP, SOB, HAs, visual changes, edema.  Taking meds regularly.   Review of Systems For ROS see HPI     Objective:   Physical Exam  Vitals reviewed. Constitutional: She is oriented to person, place, and time. She appears well-developed and well-nourished. No distress.  HENT:  Head: Normocephalic and atraumatic.  Eyes: Conjunctivae and EOM are normal. Pupils are equal, round, and reactive to light.  Neck: Normal range of motion. Neck supple. No thyromegaly present.  Cardiovascular: Normal rate, regular rhythm, normal heart sounds and intact distal pulses.   No murmur heard. Pulmonary/Chest: Effort normal and breath sounds normal. No respiratory distress.  Abdominal: Soft. She exhibits no distension. There is no tenderness.  Musculoskeletal: She exhibits no edema.  Lymphadenopathy:    She has no cervical adenopathy.  Neurological: She is alert and oriented to person, place, and time.  Skin: Skin is warm and dry.  Psychiatric: She has a normal mood and affect. Her behavior is normal.          Assessment & Plan:

## 2013-04-27 ENCOUNTER — Ambulatory Visit: Payer: Managed Care, Other (non HMO) | Admitting: *Deleted

## 2013-04-27 DIAGNOSIS — Z0279 Encounter for issue of other medical certificate: Secondary | ICD-10-CM

## 2013-04-27 NOTE — Patient Instructions (Addendum)
Pt advised to continue with current dose of bystolic as ordered and to check B/P at home a couple of times per week. Pt advised to follow low sodium diet and limit the amt of caffeinated beverages she consumes. Patient advised to follow up with provider as needed.

## 2013-04-30 ENCOUNTER — Telehealth: Payer: Self-pay | Admitting: *Deleted

## 2013-04-30 NOTE — Telephone Encounter (Signed)
FMLA paperwork completed and faxed to [employer], attempted to contact pt to make aware but mailbox was full.

## 2013-06-18 ENCOUNTER — Other Ambulatory Visit: Payer: Self-pay | Admitting: *Deleted

## 2013-06-18 MED ORDER — NEBIVOLOL HCL 20 MG PO TABS: 1.0000 | ORAL_TABLET | Freq: Every day | ORAL | Status: AC

## 2013-06-18 NOTE — Telephone Encounter (Signed)
Rx was refilled for Bystolic 20 mg.  Ag cma

## 2013-06-24 ENCOUNTER — Telehealth: Payer: Self-pay | Admitting: *Deleted

## 2013-06-24 MED ORDER — NORTRIPTYLINE HCL 25 MG PO CAPS: 25.0000 mg | ORAL_CAPSULE | Freq: Every day | ORAL | Status: AC

## 2013-06-24 NOTE — Telephone Encounter (Signed)
Ok for #30, 3 refills 

## 2013-06-24 NOTE — Telephone Encounter (Signed)
Med filled.  

## 2013-06-24 NOTE — Telephone Encounter (Signed)
Refilled request  For Nortriptyline 25 mg  Last ov-03/11/13 Last date filled-08/15/12 #30 3 R No contract  No UDS   Please Advise  Ag cma

## 2013-07-17 ENCOUNTER — Telehealth: Payer: Self-pay | Admitting: *Deleted

## 2013-07-17 DIAGNOSIS — Z0279 Encounter for issue of other medical certificate: Secondary | ICD-10-CM

## 2013-07-17 NOTE — Telephone Encounter (Signed)
Disability forms completed and placed in providers box for signature.

## 2013-07-23 ENCOUNTER — Telehealth: Payer: Self-pay | Admitting: *Deleted

## 2013-07-23 ENCOUNTER — Other Ambulatory Visit: Payer: Self-pay | Admitting: Family Medicine

## 2013-07-23 DIAGNOSIS — E7800 Pure hypercholesterolemia, unspecified: Secondary | ICD-10-CM

## 2013-07-23 DIAGNOSIS — I10 Essential (primary) hypertension: Secondary | ICD-10-CM

## 2013-07-23 NOTE — Telephone Encounter (Signed)
Patient hs dropped off a wellness form to be completed. It requires lipid panel results be included. The only labs the patient has had this year was a BMP on 12/02/2012. No history of hyperlipidemia, is this appropriate to order?

## 2013-07-23 NOTE — Telephone Encounter (Signed)
Pt has labs from Dec 2013 so as long as her form can be within the last 12 months, she's ok.  If not, she'll need a lab appt

## 2013-07-23 NOTE — Telephone Encounter (Signed)
Called pt and left a detailed message on patient's voicemail notifying the need for a fasting lab appt before paperwork can be completed. Papers are in plastic container on my desk.

## 2013-07-24 ENCOUNTER — Other Ambulatory Visit (INDEPENDENT_AMBULATORY_CARE_PROVIDER_SITE_OTHER): Payer: Managed Care, Other (non HMO)

## 2013-07-24 DIAGNOSIS — E7800 Pure hypercholesterolemia, unspecified: Secondary | ICD-10-CM

## 2013-07-24 LAB — LIPID PANEL
Cholesterol: 146 mg/dL (ref 0–200)
LDL Cholesterol: 80 mg/dL (ref 0–99)
Total CHOL/HDL Ratio: 3
Triglycerides: 69 mg/dL (ref 0.0–149.0)

## 2013-07-28 ENCOUNTER — Ambulatory Visit (INDEPENDENT_AMBULATORY_CARE_PROVIDER_SITE_OTHER): Payer: Managed Care, Other (non HMO) | Admitting: Family Medicine

## 2013-07-28 DIAGNOSIS — Z23 Encounter for immunization: Secondary | ICD-10-CM

## 2013-08-21 ENCOUNTER — Telehealth: Payer: Self-pay | Admitting: Family Medicine

## 2013-08-21 NOTE — Telephone Encounter (Signed)
Patient called and stated that she takes nortriptyline (PAMELOR) 25 MG capsule but when she picked it up from the pharmacy it was 12.5 mg. Please advise patient.

## 2013-08-21 NOTE — Telephone Encounter (Signed)
Pt advised she has refills for nortriptiline 25mg  at [facility] pharmacy she can fill. Also advised that the medication does not come in 12.5mg .

## 2013-12-07 ENCOUNTER — Encounter: Payer: Managed Care, Other (non HMO) | Admitting: Family Medicine

## 2014-07-22 ENCOUNTER — Other Ambulatory Visit: Payer: Self-pay

## 2014-07-22 DIAGNOSIS — Z1239 Encounter for other screening for malignant neoplasm of breast: Secondary | ICD-10-CM

## 2014-08-03 ENCOUNTER — Ambulatory Visit
Admission: RE | Admit: 2014-08-03 | Discharge: 2014-08-03 | Disposition: A | Discharge status: Home / Self Care | Payer: Medicaid Other | Source: Ambulatory Visit

## 2014-08-03 ENCOUNTER — Encounter (INDEPENDENT_AMBULATORY_CARE_PROVIDER_SITE_OTHER): Payer: Self-pay

## 2014-08-03 DIAGNOSIS — Z1239 Encounter for other screening for malignant neoplasm of breast: Secondary | ICD-10-CM

## 2014-08-23 ENCOUNTER — Encounter: Payer: Self-pay | Admitting: Family Medicine

## 2016-11-18 ENCOUNTER — Emergency Department (HOSPITAL_COMMUNITY)
Admission: EM | Admit: 2016-11-18 | Discharge: 2016-11-19 | Disposition: A | Discharge status: Home / Self Care | Payer: Medicaid Other | Attending: Emergency Medicine | Admitting: Emergency Medicine

## 2016-11-18 ENCOUNTER — Encounter (HOSPITAL_COMMUNITY): Payer: Self-pay | Admitting: Emergency Medicine

## 2016-11-18 DIAGNOSIS — I10 Essential (primary) hypertension: Secondary | ICD-10-CM | POA: Insufficient documentation

## 2016-11-18 DIAGNOSIS — Z79899 Other long term (current) drug therapy: Secondary | ICD-10-CM | POA: Insufficient documentation

## 2016-11-18 DIAGNOSIS — H5713 Ocular pain, bilateral: Secondary | ICD-10-CM | POA: Diagnosis present

## 2016-11-18 DIAGNOSIS — H1013 Acute atopic conjunctivitis, bilateral: Secondary | ICD-10-CM | POA: Insufficient documentation

## 2016-11-18 DIAGNOSIS — H5789 Other specified disorders of eye and adnexa: Secondary | ICD-10-CM

## 2016-11-18 MED ORDER — FLUORESCEIN SODIUM 0.6 MG OP STRP
1.0000 | ORAL_STRIP | Freq: Once | OPHTHALMIC | Status: AC
  Administered 2016-11-19: 1 via OPHTHALMIC
  Filled 2016-11-18 (×2): qty 1

## 2016-11-18 MED ORDER — PROPARACAINE HCL 0.5 % OP SOLN
1.0000 [drp] | Freq: Once | OPHTHALMIC | Status: AC
  Administered 2016-11-19: 1 [drp] via OPHTHALMIC
  Filled 2016-11-18: qty 15

## 2016-11-18 NOTE — ED Triage Notes (Signed)
Pt c/o blurred vision to both eyes with sensitivity to light.

## 2016-11-18 NOTE — ED Notes (Signed)
Patient stat that she normally wears glasses.  Patient was unable to read top line of visual acuity chart.

## 2016-11-18 NOTE — ED Notes (Signed)
Wrong patient responded to when called back to room in pod A. Triage had not been completed at this point.

## 2016-11-18 NOTE — ED Provider Notes (Signed)
[facility] DEPT Provider Note   CSN: [id number] Arrival date & time: 11/18/16  2234     History   Chief Complaint Chief Complaint  Patient presents with  . Eye Problem    HPI [name] is a 46 y.o. female.  HPI  This is a 46 yo With history of hypertension, migraines, allergies bilateral eye pain and redness. Patient reports that she developed acute onset of bilateral eye itching, redness, and pain. She states that this happened after eating dinner. She's never had this happen before. She denies any foreign body. She does report working under a "heat lamp" at work. However, this is never bothered her eyes. She wears glasses but not contacts. She denies any respiratory symptoms. She tried Visine and a eyewash at home with no relief. She reports blurry vision. She also reports photophobia.  Past Medical History:  Diagnosis Date  . 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 Allergic rhinitis   . Eczema   . Hypertension   . Migraine     Patient Active Problem List   Diagnosis Date Noted  . General medical examination 10/22/2011  . HTN (hypertension) 09/07/2011  . CONSTIPATION 08/14/2010  . ABDOMINAL PAIN, SUPRAPUBIC 08/14/2010  . STYE 03/13/2010  . IMPETIGO 10/05/2009  . URGENCY OF URINATION 05/25/2009  . ECZEMA 11/16/2008  . NECK PAIN 02/11/2008  . MIGRAINE HEADACHE 06/26/2007  . ALLERGIC RHINITIS 06/26/2007    Past Surgical History:  Procedure Laterality Date  . BREAST BIOPSY  2002   right breast-benign  . CERVICAL BIOPSY  W/ LOOP ELECTRODE EXCISION  2000   CIN-3  . LAPAROSCOPY    . TUBAL LIGATION  2009    OB History    Gravida Para Term Preterm AB Living   3 3       3    SAB TAB Ectopic Multiple Live Births                   Home Medications    Prior to Admission medications   Medication Sig Start Date End Date Taking? Authorizing Provider  AMBULATORY NON FORMULARY MEDICATION Place 600 mg vaginally 2 (two) times a week. Medication Name: Boric Acid  Suppositories 600 mg 1 pv twice weekly x 4 weeks then as needed 08/21/12   Earnstine Regal, PA-C  benzonatate (TESSALON) 200 MG capsule Take 1 capsule (200 mg total) by mouth 3 (three) times daily as needed for cough. 12/02/12   Midge Minium, MD  erythromycin ophthalmic ointment Place a 1/2 inch ribbon of ointment into the lower eyelid BID 11/19/16   Merryl Hacker, MD  metroNIDAZOLE (METROGEL VAGINAL) 0.75 % vaginal gel 1 po daily x 5 days 08/21/12   Earnstine Regal, PA-C  naproxen (NAPROSYN) 500 MG tablet Take 1 tablet (500 mg total) by mouth 2 (two) times daily. 11/19/16   Merryl Hacker, MD  naratriptan (AMERGE) 2.5 MG tablet Take 1 tablet (2.5 mg total) by mouth as needed for migraine. Take one (1) tablet at onset of headache; if returns or does not resolve, may repeat after 4 hours; do not exceed five (5) mg in 24 hours. 12/29/12 12/29/13  Midge Minium, MD  Nebivolol HCl (BYSTOLIC) 20 MG TABS Take 1 tablet (20 mg total) by mouth daily. 06/18/13   Midge Minium, MD  nortriptyline (PAMELOR) 25 MG capsule Take 1 capsule (25 mg total) by mouth at bedtime. 06/24/13   Midge Minium, MD  potassium chloride SA (K-DUR,KLOR-CON) 20  MEQ tablet Take 1 tablet (20 mEq total) by mouth daily. 12/02/12   Midge Minium, MD  promethazine (PHENERGAN) 25 MG tablet Take 1 tablet (25 mg total) by mouth every 8 (eight) hours as needed. 12/29/12   Midge Minium, MD    Family History Family History  Problem Relation Age of Onset  . Diabetes    . Hypertension    . Breast cancer Mother 95    early 65 s  . Breast cancer Maternal Aunt 50    early 95 s    Social History Social History  Substance Use Topics  . Smoking status: Never Smoker  . Smokeless tobacco: Never Used  . Alcohol use No     Allergies   Metronidazole   Review of Systems Review of Systems  Constitutional: Negative for fever.  HENT: Positive for ear pain. Negative for congestion, facial swelling and sinus pain.     Eyes: Positive for photophobia, pain, redness, itching and visual disturbance. Negative for discharge.  Neurological: Negative for headaches.  All other systems reviewed and are negative.    Physical Exam Updated Vital Signs BP 136/93   Pulse 72   Resp 18   Ht 5' 4"  (1.626 m)   Wt 115 lb (52.2 kg)   SpO2 100%   BMI 19.74 kg/m   Physical Exam  Constitutional: She is oriented to person, place, and time. She appears well-developed and well-nourished.  Eyes closed upon entering the room  HENT:  Head: Normocephalic and atraumatic.  Eyes: EOM are normal. Pupils are equal, round, and reactive to light.  Pupils 4 mm and reactive bilaterally, bilateral scleral injection, no foreign bodies noted with lid inversion, no fluoroscein uptake  Cardiovascular: Normal rate and regular rhythm.   Pulmonary/Chest: Effort normal. No respiratory distress.  Neurological: She is alert and oriented to person, place, and time.  Skin: Skin is warm and dry.  Psychiatric: She has a normal mood and affect.  Nursing note and vitals reviewed.    ED Treatments / Results  Labs (all labs ordered are listed, but only abnormal results are displayed) Labs Reviewed - No data to display  EKG  EKG Interpretation None       Radiology No results found.  Procedures Procedures (including critical care time)  Medications Ordered in ED Medications  proparacaine (ALCAINE) 0.5 % ophthalmic solution 1 drop (1 drop Both Eyes Given 11/19/16 0002)  fluorescein ophthalmic strip 1 strip (1 strip Both Eyes Given 11/19/16 0002)     Initial Impression / Assessment and Plan / ED Course  I have reviewed the triage vital signs and the nursing notes.  Pertinent labs & imaging results that were available during my care of the patient were reviewed by me and considered in my medical decision making (see chart for details).    Patient presents with bilateral eye itching, pain, scleral and conjunctival injection. She  has significant photophobia.  Exam only notable for scleral and conjunctival injection. No obvious corneal abrasion. Pupils are reactive bilaterally. Doubt glaucoma. No floor seen uptake suggestive of keratitis/iritis. Given itching, patient may have a allergic conjunctivitis. Visual acuity 20/80 bilaterally without glasses.  Naproxen for pain. Erythromycin ointment twice a day. Follow-up with ophthalmology.  After history, exam, and medical workup I feel the patient has been appropriately medically screened and is safe for discharge home. Pertinent diagnoses were discussed with the patient. Patient was given return precautions.   Final Clinical Impressions(s) / ED Diagnoses   Final diagnoses:  Eye irritation  Allergic conjunctivitis of both eyes    New Prescriptions New Prescriptions   ERYTHROMYCIN OPHTHALMIC OINTMENT    Place a 1/2 inch ribbon of ointment into the lower eyelid BID   NAPROXEN (NAPROSYN) 500 MG TABLET    Take 1 tablet (500 mg total) by mouth 2 (two) times daily.     Merryl Hacker, MD 11/19/16 [phone]

## 2016-11-19 MED ORDER — NAPROXEN 500 MG PO TABS: 500.0000 mg | ORAL_TABLET | Freq: Two times a day (BID) | ORAL | 0 refills | Status: DC

## 2016-11-19 MED ORDER — ERYTHROMYCIN 5 MG/GM OP OINT: TOPICAL_OINTMENT | OPHTHALMIC | 0 refills | Status: DC

## 2016-11-19 NOTE — Discharge Instructions (Signed)
You were seen today for bilateral eye irritation. This is likely a form of conjunctivitis. Given itching and history of allergies, this could be allergic conjunctivitis. There is no evidence of corneal abrasion. You'll be empirically started on erythromycin ointment. Follow-up with ophthalmology.

## 2016-11-19 NOTE — ED Notes (Signed)
2nd visual acuity was conducted with out prescription glasses.

## 2017-05-25 ENCOUNTER — Ambulatory Visit (HOSPITAL_COMMUNITY)
Admission: EM | Admit: 2017-05-25 | Discharge: 2017-05-25 | Disposition: A | Discharge status: Home / Self Care | Payer: Medicaid Other | Attending: Family Medicine | Admitting: Family Medicine

## 2017-05-25 ENCOUNTER — Telehealth (HOSPITAL_COMMUNITY): Payer: Self-pay | Admitting: Family Medicine

## 2017-05-25 ENCOUNTER — Encounter (HOSPITAL_COMMUNITY): Payer: Self-pay | Admitting: Emergency Medicine

## 2017-05-25 DIAGNOSIS — H60312 Diffuse otitis externa, left ear: Secondary | ICD-10-CM

## 2017-05-25 MED ORDER — AMOXICILLIN 875 MG PO TABS: 875.0000 mg | ORAL_TABLET | Freq: Two times a day (BID) | ORAL | 0 refills | Status: DC

## 2017-05-25 MED ORDER — NEOMYCIN-POLYMYXIN-HC 3.5-10000-1 OT SUSP: 4.0000 [drp] | Freq: Three times a day (TID) | OTIC | 0 refills | Status: DC

## 2017-05-25 MED ORDER — MICONAZOLE NITRATE 2 % VA CREA: 1.0000 | TOPICAL_CREAM | Freq: Every day | VAGINAL | 0 refills | Status: DC

## 2017-05-25 MED ORDER — NEOMYCIN-POLYMYXIN-HC 3.5-10000-1 OT SUSP: 4.0000 [drp] | Freq: Three times a day (TID) | OTIC | 0 refills | Status: AC

## 2017-05-25 NOTE — ED Provider Notes (Signed)
[facility]   [id number] 05/25/17 Arrival Time: [place]:  1. Acute diffuse otitis externa of left ear     Meds ordered this encounter  Medications  . DISCONTD: neomycin-polymyxin-hydrocortisone (CORTISPORIN) 3.5-10000-1 OTIC suspension    Sig: Place 4 drops into the left ear 3 (three) times daily.    Dispense:  10 mL    Refill:  0    Order Specific Question:   Supervising Provider    Answer:   Sherlene Shams [[id number]]  . DISCONTD: amoxicillin (AMOXIL) 875 MG tablet    Sig: Take 1 tablet (875 mg total) by mouth 2 (two) times daily.    Dispense:  20 tablet    Refill:  0    Order Specific Question:   Supervising Provider    Answer:   Sherlene Shams [[id number]]  . DISCONTD: miconazole (MONISTAT 7) 2 % vaginal cream    Sig: Place 1 Applicatorful vaginally at bedtime.    Dispense:  45 g    Refill:  0    Order Specific Question:   Supervising Provider    Answer:   Sherlene Shams [[id number]]  . neomycin-polymyxin-hydrocortisone (CORTISPORIN) 3.5-10000-1 OTIC suspension    Sig: Place 4 drops into the left ear 3 (three) times daily.    Dispense:  10 mL    Refill:  0    Order Specific Question:   Supervising Provider    Answer:   Sherlene Shams [[id number]]  . amoxicillin (AMOXIL) 875 MG tablet    Sig: Take 1 tablet (875 mg total) by mouth 2 (two) times daily.    Dispense:  20 tablet    Refill:  0    Order Specific Question:   Supervising Provider    Answer:   Sherlene Shams [[id number]]    Reviewed expectations re: course of current medical issues. Questions answered. Outlined signs and symptoms indicating need for more acute intervention. Patient verbalized understanding. After Visit Summary given.   SUBJECTIVE:  [name] is a 46 y.o. female who presents with complaint of Left ear pain for a day  ROS: As per HPI.   OBJECTIVE:  Vitals:   05/25/17 1833  BP: (!) 143/87  Pulse: 77  Resp: 16  Temp: 98.9 F (37.2 C)  TempSrc: Oral  SpO2: 99%     General appearance: alert; no distress HEENT: normocephalic; atraumatic; conjunctivae normal; Left EAC diminished and left tragus tender. Neck: supple Lungs: clear to auscultation bilaterally Heart: regular rate and rhythm Abdomen: soft, non-tender; bowel sounds normal; no masses or organomegaly; no guarding or rebound tenderness Back: no CVA tenderness Extremities: no cyanosis or edema; symmetrical with no gross deformities Skin: warm and dry Neurologic: normal symmetric reflexes; normal gait Psychological:  alert and cooperative; normal mood and affect  Results for orders placed or performed in visit on 07/24/13  Lipid panel  Result Value Ref Range   Cholesterol 146 0 - 200 mg/dL   Triglycerides 69.0 0.0 - 149.0 mg/dL   HDL 52.50 >39.00 mg/dL   VLDL 13.8 0.0 - 40.0 mg/dL   LDL Cholesterol 80 0 - 99 mg/dL   Total CHOL/HDL Ratio 3     Labs Reviewed - No data to display  No results found.  Allergies  Allergen Reactions  . Metronidazole     REACTION: nausea    PMHx, SurgHx, SocialHx, Medications, and Allergies were reviewed in the Visit Navigator and updated as appropriate.      Lysbeth Penner,  FNP 05/25/17 1959

## 2017-05-25 NOTE — ED Triage Notes (Signed)
The patient presented to the [facility] with a complaint of left ear pain x 1 day.

## 2017-05-25 NOTE — Telephone Encounter (Signed)
Pt called and stated that [facility] [address] is closed and wanted [place] to [facility] [address].

## 2018-01-09 ENCOUNTER — Other Ambulatory Visit: Payer: Self-pay | Admitting: Nurse Practitioner

## 2018-01-09 DIAGNOSIS — Z1231 Encounter for screening mammogram for malignant neoplasm of breast: Secondary | ICD-10-CM

## 2018-02-05 ENCOUNTER — Ambulatory Visit
Admission: RE | Admit: 2018-02-05 | Discharge: 2018-02-05 | Disposition: A | Discharge status: Home / Self Care | Payer: Medicaid Other | Source: Ambulatory Visit | Attending: Nurse Practitioner | Admitting: Nurse Practitioner

## 2018-02-05 DIAGNOSIS — Z1231 Encounter for screening mammogram for malignant neoplasm of breast: Secondary | ICD-10-CM

## 2019-06-18 ENCOUNTER — Encounter (HOSPITAL_BASED_OUTPATIENT_CLINIC_OR_DEPARTMENT_OTHER): Payer: Self-pay | Admitting: *Deleted

## 2019-06-18 ENCOUNTER — Other Ambulatory Visit: Payer: Self-pay

## 2019-06-18 ENCOUNTER — Emergency Department (HOSPITAL_BASED_OUTPATIENT_CLINIC_OR_DEPARTMENT_OTHER): Payer: Medicaid Other

## 2019-06-18 ENCOUNTER — Emergency Department (HOSPITAL_BASED_OUTPATIENT_CLINIC_OR_DEPARTMENT_OTHER)
Admission: EM | Admit: 2019-06-18 | Discharge: 2019-06-18 | Disposition: A | Discharge status: Home / Self Care | Payer: Medicaid Other | Attending: Emergency Medicine | Admitting: Emergency Medicine

## 2019-06-18 DIAGNOSIS — W208XXA Other cause of strike by thrown, projected or falling object, initial encounter: Secondary | ICD-10-CM | POA: Diagnosis not present

## 2019-06-18 DIAGNOSIS — Y9389 Activity, other specified: Secondary | ICD-10-CM | POA: Insufficient documentation

## 2019-06-18 DIAGNOSIS — Y999 Unspecified external cause status: Secondary | ICD-10-CM | POA: Insufficient documentation

## 2019-06-18 DIAGNOSIS — I10 Essential (primary) hypertension: Secondary | ICD-10-CM | POA: Diagnosis not present

## 2019-06-18 DIAGNOSIS — S098XXA Other specified injuries of head, initial encounter: Secondary | ICD-10-CM | POA: Insufficient documentation

## 2019-06-18 DIAGNOSIS — Z79899 Other long term (current) drug therapy: Secondary | ICD-10-CM | POA: Diagnosis not present

## 2019-06-18 DIAGNOSIS — Y92810 Car as the place of occurrence of the external cause: Secondary | ICD-10-CM | POA: Insufficient documentation

## 2019-06-18 DIAGNOSIS — S0990XA Unspecified injury of head, initial encounter: Secondary | ICD-10-CM | POA: Diagnosis present
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
## 2019-06-18 NOTE — Discharge Instructions (Addendum)
The CT scan of your head is normal today. It is possible you have a mild concussion.  You can treat your headache with over-the-counter medications such as tylenol as needed. Stay hydrated and get plenty of rest. Limit your screen time and complex thinking. Avoid any contact sports/activities to prevent re-injury to your head. Follow up with your primary care provider in 1 week for re-check and to be cleared to return to normal activity. Return to the ER if you develop severely worsening headache, changes in your vision, persistent vomiting, or new or concerning symptoms.
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
## 2020-03-07 LAB — COLOGUARD: COLOGUARD: NEGATIVE

## 2020-10-23 ENCOUNTER — Ambulatory Visit (HOSPITAL_COMMUNITY)
Admission: EM | Admit: 2020-10-23 | Discharge: 2020-10-23 | Discharge status: Absent without leave | Payer: Medicaid Other

## 2023-06-02 LAB — COLOGUARD: COLOGUARD: NEGATIVE

## 2024-04-06 ENCOUNTER — Other Ambulatory Visit: Payer: Self-pay

## 2024-04-06 ENCOUNTER — Encounter (HOSPITAL_BASED_OUTPATIENT_CLINIC_OR_DEPARTMENT_OTHER): Payer: Self-pay

## 2024-04-06 DIAGNOSIS — K5792 Diverticulitis of intestine, part unspecified, without perforation or abscess without bleeding: Secondary | ICD-10-CM | POA: Insufficient documentation

## 2024-04-06 DIAGNOSIS — R1084 Generalized abdominal pain: Secondary | ICD-10-CM | POA: Diagnosis present

## 2024-04-06 DIAGNOSIS — I10 Essential (primary) hypertension: Secondary | ICD-10-CM | POA: Insufficient documentation

## 2024-04-06 NOTE — ED Triage Notes (Signed)
 Pt states she developed abdominal pain 2 days ago.   Was seen at [facility] today and was given Toradol , zofran IM and script for Zofran.  Pt states pain has returned.   Pt describes pain as pressure and mostly in the suprapubic area and hurts more when walking.

## 2024-04-07 ENCOUNTER — Emergency Department (HOSPITAL_BASED_OUTPATIENT_CLINIC_OR_DEPARTMENT_OTHER)
Admission: EM | Admit: 2024-04-07 | Discharge: 2024-04-07 | Disposition: A | Discharge status: Home / Self Care | Attending: Emergency Medicine | Admitting: Emergency Medicine

## 2024-04-07 ENCOUNTER — Emergency Department (HOSPITAL_BASED_OUTPATIENT_CLINIC_OR_DEPARTMENT_OTHER)

## 2024-04-07 DIAGNOSIS — K5792 Diverticulitis of intestine, part unspecified, without perforation or abscess without bleeding: Secondary | ICD-10-CM

## 2024-04-07 LAB — CBC WITH DIFFERENTIAL/PLATELET
Abs Immature Granulocytes: 0.02 10*3/uL (ref 0.00–0.07)
Basophils Absolute: 0 10*3/uL (ref 0.0–0.1)
Basophils Relative: 0 %
Eosinophils Absolute: 0.1 10*3/uL (ref 0.0–0.5)
Eosinophils Relative: 1 %
HCT: 37.9 % (ref 36.0–46.0)
Hemoglobin: 13 g/dL (ref 12.0–15.0)
Immature Granulocytes: 0 %
Lymphocytes Relative: 12 %
Lymphs Abs: 1.3 10*3/uL (ref 0.7–4.0)
MCH: 31.8 pg (ref 26.0–34.0)
MCHC: 34.3 g/dL (ref 30.0–36.0)
MCV: 92.7 fL (ref 80.0–100.0)
Monocytes Absolute: 0.9 10*3/uL (ref 0.1–1.0)
Monocytes Relative: 8 %
Neutro Abs: 8.6 10*3/uL — ABNORMAL HIGH (ref 1.7–7.7)
Neutrophils Relative %: 79 %
Platelets: 227 10*3/uL (ref 150–400)
RBC: 4.09 MIL/uL (ref 3.87–5.11)
RDW: 12.2 % (ref 11.5–15.5)
WBC: 10.8 10*3/uL — ABNORMAL HIGH (ref 4.0–10.5)
nRBC: 0 % (ref 0.0–0.2)

## 2024-04-07 LAB — URINALYSIS, MICROSCOPIC (REFLEX)

## 2024-04-07 LAB — COMPREHENSIVE METABOLIC PANEL WITH GFR
ALT: 12 U/L (ref 0–44)
AST: 19 U/L (ref 15–41)
Albumin: 4 g/dL (ref 3.5–5.0)
Alkaline Phosphatase: 62 U/L (ref 38–126)
Anion gap: 11 (ref 5–15)
BUN: 12 mg/dL (ref 6–20)
CO2: 24 mmol/L (ref 22–32)
Calcium: 9.1 mg/dL (ref 8.9–10.3)
Chloride: 104 mmol/L (ref 98–111)
Creatinine, Ser: 0.72 mg/dL (ref 0.44–1.00)
GFR, Estimated: >60 mL/min (ref >60)
Glucose, Bld: 144 mg/dL — ABNORMAL HIGH (ref 70–99)
Potassium: 3.4 mmol/L — ABNORMAL LOW (ref 3.5–5.1)
Sodium: 139 mmol/L (ref 135–145)
Total Bilirubin: 0.3 mg/dL (ref 0.0–1.2)
Total Protein: 6.9 g/dL (ref 6.5–8.1)

## 2024-04-07 LAB — LIPASE, BLOOD: Lipase: 28 U/L (ref 11–51)

## 2024-04-07 LAB — URINALYSIS, ROUTINE W REFLEX MICROSCOPIC
Bilirubin Urine: NEGATIVE
Glucose, UA: NEGATIVE mg/dL
Ketones, ur: NEGATIVE mg/dL
Leukocytes,Ua: NEGATIVE
Nitrite: NEGATIVE
Protein, ur: NEGATIVE mg/dL
Specific Gravity, Urine: <1.005 — ABNORMAL LOW (ref 1.005–1.030)
pH: 8 (ref 5.0–8.0)

## 2024-04-07 MED ORDER — AMOXICILLIN-POT CLAVULANATE 875-125 MG PO TABS
1.0000 | ORAL_TABLET | Freq: Once | ORAL | Status: AC
  Administered 2024-04-07: 1 via ORAL
  Filled 2024-04-07: qty 1

## 2024-04-07 MED ORDER — MORPHINE SULFATE (PF) 4 MG/ML IV SOLN
4.0000 mg | Freq: Once | INTRAVENOUS | Status: AC
  Administered 2024-04-07: 4 mg via INTRAVENOUS
  Filled 2024-04-07: qty 1

## 2024-04-07 MED ORDER — IOHEXOL 300 MG/ML  SOLN
100.0000 mL | Freq: Once | INTRAMUSCULAR | Status: AC | PRN
  Administered 2024-04-07: 100 mL via INTRAVENOUS

## 2024-04-07 MED ORDER — ONDANSETRON HCL 4 MG/2ML IJ SOLN
4.0000 mg | Freq: Once | INTRAMUSCULAR | Status: AC
  Administered 2024-04-07: 4 mg via INTRAVENOUS
  Filled 2024-04-07: qty 2

## 2024-04-07 MED ORDER — NAPROXEN 500 MG PO TABS: 500.0000 mg | ORAL_TABLET | Freq: Two times a day (BID) | ORAL | 0 refills | Status: AC

## 2024-04-07 MED ORDER — AMOXICILLIN-POT CLAVULANATE 875-125 MG PO TABS: 1.0000 | ORAL_TABLET | Freq: Two times a day (BID) | ORAL | 0 refills | Status: AC

## 2024-04-07 NOTE — Discharge Instructions (Signed)
 You were evaluated in the Emergency Department and after careful evaluation, we did not find any emergent condition requiring admission or further testing in the hospital.  Your exam/testing today was overall reassuring.  Symptoms seem to be due to diverticulitis.  Recommend use of the Augmentin antibiotic as directed.  Use the Naprosyn  twice daily as needed for pain.  Recommend follow-up with your primary care doctor after treatment.  You may need a colonoscopy after you recover, please discuss this with your primary care doctor.  Please return to the Emergency Department if you experience any worsening of your condition.  Thank you for allowing us  to be a part of your care.

## 2024-04-07 NOTE — ED Provider Notes (Signed)
 [facility] Emergency Department Provider Note MRN:  [id number]  Arrival date & time: 04/07/24     Chief Complaint   Abdominal Pain   History of Present Illness   [name] is a 53 y.o. year-old female with a history of hypertension presenting to the ED with chief complaint of abdominal pain.  Abdominal pain for the past 2 days, generalized, periumbilical but diffuse, nausea but no vomiting, no diarrhea or constipation, no fever.  Review of Systems  A thorough review of systems was obtained and all systems are negative except as noted in the HPI and PMH.   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   Allergic rhinitis    Eczema    Hypertension    Migraine     Past Surgical History:  Procedure Laterality Date   BREAST BIOPSY  2002   right breast-benign   CERVICAL BIOPSY  W/ LOOP ELECTRODE EXCISION  2000   CIN-3   LAPAROSCOPY     TUBAL LIGATION  2009    Family History  Problem Relation Age of Onset   Diabetes Other    Hypertension Other    Breast cancer Mother 88       early 32 s   Breast cancer Maternal Aunt 50       early 82 s    Social History   Socioeconomic History   Marital status: Divorced    Spouse name: Not on file   Number of children: Not on file   Years of education: Not on file   Highest education level: Not on file  Occupational History   Not on file  Tobacco Use   Smoking status: Never   Smokeless tobacco: Never  Vaping Use   Vaping status: Never Used  Substance and Sexual Activity   Alcohol use: No   Drug use: No   Sexual activity: Yes    Birth control/protection: Surgical    Comment: btl  Other Topics Concern   Not on file  Social History Narrative   Not on file   Social Drivers of Health   Financial Resource Strain: Medium Risk (08/13/2023)   Received from [facility]   Overall Financial Resource Strain (CARDIA)    Difficulty of Paying  Living Expenses: Somewhat hard  Food Insecurity: No Food Insecurity (08/13/2023)   Received from [facility]   Hunger Vital Sign    Within the past 12 months, you worried that your food would run out before you got the money to buy more.: Never true    Within the past 12 months, the food you bought just didn't last and you didn't have money to get more.: Never true  Transportation Needs: No Transportation Needs (08/13/2023)   Received from [facility] - Transportation    Lack of Transportation (Medical): No    Lack of Transportation (Non-Medical): No  Physical Activity: Not on file  Stress: Not on file  Social Connections: Not on file  Intimate Partner Violence: Not on file     Physical Exam   Vitals:   04/07/24 0115 04/07/24 0130  BP: 135/78 130/82  Pulse: 81 80  Resp:    Temp:    SpO2: 95% 93%    CONSTITUTIONAL: Well-appearing, NAD NEURO/PSYCH:  Alert and oriented x 3, no focal deficits EYES:  eyes equal and reactive ENT/NECK:  no LAD, no JVD CARDIO: Regular rate, well-perfused,  normal S1 and S2 PULM:  CTAB no wheezing or rhonchi GI/GU:  non-distended, mild diffuse tenderness MSK/SPINE:  No gross deformities, no edema SKIN:  no rash, atraumatic   *Additional and/or pertinent findings included in MDM below  Diagnostic and Interventional Summary    EKG Interpretation Date/Time:    Ventricular Rate:    PR Interval:    QRS Duration:    QT Interval:    QTC Calculation:   R Axis:      Text Interpretation:         Labs Reviewed  COMPREHENSIVE METABOLIC PANEL WITH GFR - Abnormal; Notable for the following components:      Result Value   Potassium 3.4 (*)    Glucose, Bld 144 (*)    All other components within normal limits  CBC WITH DIFFERENTIAL/PLATELET - Abnormal; Notable for the following components:   WBC 10.8 (*)    Neutro Abs 8.6 (*)    All other components within normal limits  URINALYSIS, ROUTINE W REFLEX MICROSCOPIC - Abnormal; Notable  for the following components:   Specific Gravity, Urine <1.005 (*)    Hgb urine dipstick SMALL (*)    All other components within normal limits  URINALYSIS, MICROSCOPIC (REFLEX) - Abnormal; Notable for the following components:   Bacteria, UA RARE (*)    All other components within normal limits  LIPASE, BLOOD    CT ABDOMEN PELVIS W CONTRAST  Final Result      Medications  amoxicillin -clavulanate (AUGMENTIN) 875-125 MG per tablet 1 tablet (has no administration in time range)  ondansetron (ZOFRAN) injection 4 mg (4 mg Intravenous Given 04/07/24 0107)  morphine (PF) 4 MG/ML injection 4 mg (4 mg Intravenous Given 04/07/24 0108)  iohexol (OMNIPAQUE) 300 MG/ML solution 100 mL (100 mLs Intravenous Contrast Given 04/07/24 0054)     Procedures  /  Critical Care Procedures  ED Course and Medical Decision Making  Initial Impression and Ddx Differential diagnosis includes SBO, diverticulitis, colitis, gastroenteritis, appendicitis  Past medical/surgical history that increases complexity of ED encounter: None  Interpretation of Diagnostics I personally reviewed the Laboratory Testing and my interpretation is as follows: No significant blood count or electrolyte disturbance.  CT reveals acute uncomplicated diverticulitis  Patient Reassessment and Ultimate Disposition/Management     Patient feeling better, appropriate for discharge.  Patient management required discussion with the following services or consulting groups:  None  Complexity of Problems Addressed Acute illness or injury that poses threat of life of bodily function  Additional Data Reviewed and Analyzed Further history obtained from: Further history from spouse/family member  Additional Factors Impacting ED Encounter Risk Prescriptions, Use of parenteral controlled substances, and Consideration of hospitalization  Merrick Abe. Harless Lien, MD [facility]  Health [email or web address]  Final Clinical Impressions(s) / ED Diagnoses     ICD-10-CM   1. Diverticulitis  K57.92       ED Discharge Orders          Ordered    amoxicillin -clavulanate (AUGMENTIN) 875-125 MG tablet  Every 12 hours        04/07/24 0232    naproxen  (NAPROSYN ) 500 MG tablet  2 times daily        04/07/24 0232             Discharge Instructions Discussed with and Provided to Patient:    Discharge Instructions      You were evaluated in the Emergency Department and after careful evaluation, we did not find any emergent  condition requiring admission or further testing in the hospital.  Your exam/testing today was overall reassuring.  Symptoms seem to be due to diverticulitis.  Recommend use of the Augmentin antibiotic as directed.  Use the Naprosyn  twice daily as needed for pain.  Recommend follow-up with your primary care doctor after treatment.  You may need a colonoscopy after you recover, please discuss this with your primary care doctor.  Please return to the Emergency Department if you experience any worsening of your condition.  Thank you for allowing us  to be a part of your care.       Edson Graces, MD 04/07/24 [phone]
